# Patient Record
Sex: Female | Born: 1955 | Race: White | Hispanic: No | Smoking: Never smoker
Health system: Southern US, Community
[De-identification: ages and names within clinical notes are randomized; demographics above are authoritative.]

## PROBLEM LIST (undated history)

## (undated) DIAGNOSIS — E785 Hyperlipidemia, unspecified: Secondary | ICD-10-CM

## (undated) DIAGNOSIS — E039 Hypothyroidism, unspecified: Secondary | ICD-10-CM

## (undated) HISTORY — PX: ENDOMETRIAL ABLATION: SHX621

## (undated) HISTORY — DX: Hypothyroidism, unspecified: E03.9

## (undated) HISTORY — PX: ROTATOR CUFF REPAIR: SHX139

## (undated) HISTORY — DX: Hyperlipidemia, unspecified: E78.5

---
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
## 1999-11-28 ENCOUNTER — Other Ambulatory Visit: Admission: RE | Admit: 1999-11-28 | Discharge: 1999-11-28 | Payer: Self-pay | Admitting: Family Medicine

## 1999-12-10 ENCOUNTER — Ambulatory Visit (HOSPITAL_COMMUNITY): Admission: RE | Admit: 1999-12-10 | Discharge: 1999-12-10 | Payer: Self-pay | Admitting: Family Medicine

## 1999-12-10 ENCOUNTER — Encounter: Payer: Self-pay | Admitting: Family Medicine

## 2001-02-17 ENCOUNTER — Other Ambulatory Visit: Admission: RE | Admit: 2001-02-17 | Discharge: 2001-02-17 | Payer: Self-pay | Admitting: Family Medicine

## 2002-06-14 ENCOUNTER — Other Ambulatory Visit: Admission: RE | Admit: 2002-06-14 | Discharge: 2002-06-14 | Payer: Self-pay | Admitting: Family Medicine

## 2002-06-22 ENCOUNTER — Encounter: Payer: Self-pay | Admitting: Family Medicine

## 2002-06-22 ENCOUNTER — Ambulatory Visit (HOSPITAL_COMMUNITY): Admission: RE | Admit: 2002-06-22 | Discharge: 2002-06-22 | Payer: Self-pay | Admitting: Family Medicine

## 2003-10-05 ENCOUNTER — Other Ambulatory Visit: Admission: RE | Admit: 2003-10-05 | Discharge: 2003-10-05 | Payer: Self-pay | Admitting: Family Medicine

## 2004-10-30 ENCOUNTER — Ambulatory Visit (HOSPITAL_COMMUNITY): Admission: RE | Admit: 2004-10-30 | Discharge: 2004-10-30 | Payer: Self-pay | Admitting: Family Medicine

## 2006-06-02 ENCOUNTER — Ambulatory Visit (HOSPITAL_COMMUNITY): Admission: RE | Admit: 2006-06-02 | Discharge: 2006-06-02 | Payer: Self-pay | Admitting: Family Medicine

## 2007-06-06 ENCOUNTER — Ambulatory Visit (HOSPITAL_COMMUNITY): Admission: RE | Admit: 2007-06-06 | Discharge: 2007-06-06 | Payer: Self-pay | Admitting: Family Medicine

## 2008-06-06 ENCOUNTER — Ambulatory Visit (HOSPITAL_COMMUNITY): Admission: RE | Admit: 2008-06-06 | Discharge: 2008-06-06 | Payer: Self-pay | Admitting: Family Medicine

## 2009-07-01 ENCOUNTER — Ambulatory Visit (HOSPITAL_COMMUNITY): Admission: RE | Admit: 2009-07-01 | Discharge: 2009-07-01 | Payer: Self-pay | Admitting: Family Medicine

## 2010-07-03 ENCOUNTER — Ambulatory Visit (HOSPITAL_COMMUNITY): Admission: RE | Admit: 2010-07-03 | Discharge: 2010-07-03 | Payer: Self-pay | Admitting: Family Medicine

## 2011-06-03 ENCOUNTER — Other Ambulatory Visit (HOSPITAL_COMMUNITY): Payer: Self-pay | Admitting: Family Medicine

## 2011-06-03 DIAGNOSIS — Z1231 Encounter for screening mammogram for malignant neoplasm of breast: Secondary | ICD-10-CM

## 2011-07-06 ENCOUNTER — Ambulatory Visit (HOSPITAL_COMMUNITY)
Admission: RE | Admit: 2011-07-06 | Discharge: 2011-07-06 | Disposition: A | Discharge status: Home / Self Care | Payer: BC Managed Care – PPO | Source: Ambulatory Visit | Attending: Family Medicine | Admitting: Family Medicine

## 2011-07-06 DIAGNOSIS — Z1231 Encounter for screening mammogram for malignant neoplasm of breast: Secondary | ICD-10-CM | POA: Insufficient documentation

## 2011-08-11 ENCOUNTER — Other Ambulatory Visit: Payer: Self-pay | Admitting: Family Medicine

## 2011-08-11 DIAGNOSIS — R0989 Other specified symptoms and signs involving the circulatory and respiratory systems: Secondary | ICD-10-CM

## 2011-08-18 ENCOUNTER — Ambulatory Visit
Admission: RE | Admit: 2011-08-18 | Discharge: 2011-08-18 | Disposition: A | Discharge status: Home / Self Care | Payer: BC Managed Care – PPO | Source: Ambulatory Visit | Attending: Family Medicine | Admitting: Family Medicine

## 2011-08-18 DIAGNOSIS — R0989 Other specified symptoms and signs involving the circulatory and respiratory systems: Secondary | ICD-10-CM

## 2012-06-10 ENCOUNTER — Other Ambulatory Visit (HOSPITAL_COMMUNITY): Payer: Self-pay | Admitting: Family Medicine

## 2012-06-10 DIAGNOSIS — Z1231 Encounter for screening mammogram for malignant neoplasm of breast: Secondary | ICD-10-CM

## 2012-07-07 ENCOUNTER — Ambulatory Visit (HOSPITAL_COMMUNITY)
Admission: RE | Admit: 2012-07-07 | Discharge: 2012-07-07 | Disposition: A | Discharge status: Home / Self Care | Payer: No Typology Code available for payment source | Source: Ambulatory Visit | Attending: Family Medicine | Admitting: Family Medicine

## 2012-07-07 DIAGNOSIS — Z1231 Encounter for screening mammogram for malignant neoplasm of breast: Secondary | ICD-10-CM | POA: Insufficient documentation

## 2013-03-02 ENCOUNTER — Ambulatory Visit (INDEPENDENT_AMBULATORY_CARE_PROVIDER_SITE_OTHER): Payer: PRIVATE HEALTH INSURANCE | Admitting: Gynecology

## 2013-03-02 ENCOUNTER — Encounter: Payer: Self-pay | Admitting: Gynecology

## 2013-03-02 VITALS — BP 126/80 | HR 72 | Resp 12 | Ht 63.5 in | Wt 221.0 lb

## 2013-03-02 DIAGNOSIS — N950 Postmenopausal bleeding: Secondary | ICD-10-CM

## 2013-03-02 DIAGNOSIS — Z124 Encounter for screening for malignant neoplasm of cervix: Secondary | ICD-10-CM

## 2013-03-02 DIAGNOSIS — Z1211 Encounter for screening for malignant neoplasm of colon: Secondary | ICD-10-CM

## 2013-03-02 DIAGNOSIS — E7800 Pure hypercholesterolemia, unspecified: Secondary | ICD-10-CM

## 2013-03-02 DIAGNOSIS — E039 Hypothyroidism, unspecified: Secondary | ICD-10-CM

## 2013-03-02 DIAGNOSIS — Z01419 Encounter for gynecological examination (general) (routine) without abnormal findings: Secondary | ICD-10-CM

## 2013-03-02 HISTORY — DX: Hypothyroidism, unspecified: E03.9

## 2013-03-02 LAB — CBC
MCV: 83 fL (ref 78.0–100.0)
Platelets: 308 10*3/uL (ref 150–400)
RBC: 4.82 MIL/uL (ref 3.87–5.11)
RDW: 13.5 % (ref 11.5–15.5)
WBC: 7.8 10*3/uL (ref 4.0–10.5)

## 2013-03-02 LAB — FOLLICLE STIMULATING HORMONE: FSH: 54.7 m[IU]/mL

## 2013-03-02 NOTE — Addendum Note (Signed)
Addended by: Lorraine Lax on: 03/02/2013 02:21 PM   Modules accepted: Orders

## 2013-03-02 NOTE — Patient Instructions (Signed)

## 2013-03-02 NOTE — Progress Notes (Signed)
57 y.o. Divorced Caucasian female   G2P2002 here for annual exam. Pt reports menses are not regular.  She does not report hot flashes, does not have night sweats, does not have vaginal dryness.  She is not using lubricants,.  She does report post-menopasual bleeding (spotting).  Pt reports having a "long gap" 2y ago but was never without a cycle for 1y.  No change in weight, bowel or bladder.  No sexual activity for 16y, no abnormal PAP.  No fibroids.  Pt reports discharge sometimes bloody  Patient's last menstrual period was 08/02/2012.          Sexually active: no  The current method of family planning is post menopausal status.    Exercising: yes  The patient has a physically strenuous job, but has no regular exercise apart from work.  Last pap: 6 years ago Abnormal PAP: no Mammogram: 06/10/12 BSE: yes Colonoscopy: none DEXA: none Alcohol: 1-2 drinks/wk Tobacco: no  No health maintenance topics applied.  Family History  Problem Relation Age of Onset  . Heart Problems Mother   . Heart attack Father     There are no active problems to display for this patient.   Past Medical History  Diagnosis Date  . Anemia   . Hypothyroidism     Past Surgical History  Procedure Laterality Date  . Rotator cuff repair  20 years ago  . Wisdom tooth extraction  1973    Allergies: Augmentin  Current Outpatient Prescriptions  Medication Sig Dispense Refill  . Cetirizine HCl (ZYRTEC PO) Take by mouth at bedtime as needed.      . ALPRAZolam (XANAX) 0.5 MG tablet       . levothyroxine (SYNTHROID, LEVOTHROID) 125 MCG tablet       . simvastatin (ZOCOR) 40 MG tablet        No current facility-administered medications for this visit.    ROS: Pertinent items are noted in HPI.  Exam:    BP 126/80  Pulse 72  Resp 12  Ht 5' 3.5" (1.613 m)  Wt 221 lb (100.245 kg)  BMI 38.53 kg/m2  LMP 08/02/2012 Weight change: @WEIGHTCHANGE @ Last 3 height recordings:  Ht Readings from Last 3 Encounters:   03/02/13 5' 3.5" (1.613 m)   General appearance: alert, cooperative and appears stated age Head: Normocephalic, without obvious abnormality, atraumatic Neck: no adenopathy, no carotid bruit, no JVD, supple, symmetrical, trachea midline and thyroid not enlarged, symmetric, no tenderness/mass/nodules Lungs: clear to auscultation bilaterally Breasts: normal appearance, no masses or tenderness Heart: regular rate and rhythm, S1, S2 normal, no murmur, click, rub or gallop Abdomen: soft, non-tender; bowel sounds normal; no masses,  no organomegaly Extremities: extremities normal, atraumatic, no cyanosis or edema Skin: Skin color, texture, turgor normal. No rashes or lesions Lymph nodes: Cervical, supraclavicular, and axillary nodes normal. no inguinal nodes palpated Neurologic: Grossly normal   Pelvic: External genitalia:  no lesions              Urethra: normal appearing urethra with no masses, tenderness or lesions              Bartholins and Skenes: normal                  Vagina: blood in vault              Cervix: active trickle from os, no gross lesions              Pap taken: yes  Bimanual Exam:  Uterus:  Slightly enlarged, limited by habitus                                      Adnexa:    no masses                                      Rectovaginal: Confirms                                      Anus:  normal sphincter tone, no lesions  A: Post menopausal bleeding      P: discussed that pt is at extreme of menses and recommend U/S and EMB to rule out uterine or cervical pathology.  Pt expressed understand and will return to office for evaluation.   pap smear with HPV today FSH, CBC Recommend pt have baseline coloscopy after bleeding evaluation complete return annually or prn   An After Visit Summary was printed and given to the patient.

## 2013-03-06 ENCOUNTER — Ambulatory Visit (INDEPENDENT_AMBULATORY_CARE_PROVIDER_SITE_OTHER): Payer: PRIVATE HEALTH INSURANCE | Admitting: Gynecology

## 2013-03-06 ENCOUNTER — Ambulatory Visit (INDEPENDENT_AMBULATORY_CARE_PROVIDER_SITE_OTHER): Payer: PRIVATE HEALTH INSURANCE

## 2013-03-06 ENCOUNTER — Other Ambulatory Visit: Payer: Self-pay | Admitting: Gynecology

## 2013-03-06 DIAGNOSIS — N83339 Acquired atrophy of ovary and fallopian tube, unspecified side: Secondary | ICD-10-CM

## 2013-03-06 DIAGNOSIS — N950 Postmenopausal bleeding: Secondary | ICD-10-CM

## 2013-03-06 DIAGNOSIS — N840 Polyp of corpus uteri: Secondary | ICD-10-CM | POA: Insufficient documentation

## 2013-03-06 DIAGNOSIS — N8300 Follicular cyst of ovary, unspecified side: Secondary | ICD-10-CM

## 2013-03-06 MED ORDER — MISOPROSTOL 200 MCG PO TABS: ORAL_TABLET | ORAL | Status: DC

## 2013-03-06 NOTE — Progress Notes (Signed)
Pt here for u/s, emb for PMB, u/s images rebviewed with pt, lining notyed to be 12.75mm, homogeneous in nature, based on that, we recommend that the pt have a sonohysterogram, consent obtained. The speculum was placed, no bleeding from cervix was noted today, cervix was cleansed with betadine and insemination catheter was advanced, the walls were distended.  The findings were as noted below: endometrial defect 3.3x0.9x1.4cm noted with fundal attachment, suspicions for polyp.  Speculum was replaced and endometrial biopsy was obtained, uterus sounded to 9cm.  Pt tolerated both procedures well. Tissue to path.  We discussed proceeding woth a d&c hysteroscopy.  The procedure was discussed at length, the ACOG handoutwas not given to the patientprior to the consult.  Pt informed regarding risks and benefits of surgery, including but not limited to bleeding, infections, damage to bowel or bladder due to uterine perforation either during the procedure or during dilation.  A medication will be be given preoperatively to soften the cervix.    There is a risk of formation of a deep vein thrombus in the extremities was discussed, although PAS will be placed to minimize the risk, the patient was informed that a pulmonary embolism could still form and could result in death. Perineal nerve damage from placement during surgery was discussed She was instructed on signs and symptoms to be aware of and the need to call if they should develop.  Fluid overload from the distending media was discussed as was the usual intra-operative safety precautions in place.  All questions were addressed.  Post-operative medications  were not given to the patient.   Length of time discussing above 65m, >50% face to face

## 2013-03-06 NOTE — Addendum Note (Signed)
Addended by: Douglass Rivers on: 03/06/2013 04:09 PM   Modules accepted: Orders

## 2013-03-08 ENCOUNTER — Telehealth: Payer: Self-pay | Admitting: Orthopedic Surgery

## 2013-03-08 NOTE — Telephone Encounter (Signed)
Spoke with pt about appt with Dr. Loreta Ave 03-23-13 at 10:30. Pt to call and reschedule. Phone number given.

## 2013-03-09 ENCOUNTER — Telehealth: Payer: Self-pay | Admitting: Gynecology

## 2013-03-09 NOTE — Telephone Encounter (Signed)
Spoke with patient about her insurance benefits for surgery, Patient agrees to pay the full amount 2 weeks prior to her surgery date. Patient states that she discussed a surgery date with Dr. Farrel Gobble and they decided on 7/16. Chart passed onto [facility] for scheduling.

## 2013-03-10 LAB — IPS CERVICAL/ECC/EMB/VULVAR/VAGINAL BIOPSY

## 2013-03-16 ENCOUNTER — Telehealth: Payer: Self-pay | Admitting: *Deleted

## 2013-03-16 NOTE — Telephone Encounter (Signed)
Message copied by Alisa Graff on Thu Mar 16, 2013 11:47 AM ------      Message from: Douglass Rivers      Created: Sun Mar 12, 2013 11:16 AM       Did I put in for d&c hysteroscopy? Results to pt via mychart ------

## 2013-03-16 NOTE — Telephone Encounter (Signed)
Patient notified that Hysteroscopy/polyp resect/D&C sched for 04-05-13 at [facility] at 1000. Instructions given and mailed to patient. Post op appt scheduled. Call PRN

## 2013-03-16 NOTE — Telephone Encounter (Signed)
Message copied by Alisa Graff on Thu Mar 16, 2013 11:56 AM ------      Message from: Douglass Rivers      Created: Sun Mar 12, 2013 11:16 AM       Did I put in for d&c hysteroscopy? Results to pt via mychart ------

## 2013-03-22 ENCOUNTER — Telehealth: Payer: Self-pay | Admitting: Gynecology

## 2013-03-22 NOTE — Telephone Encounter (Signed)
Routed to Sally 

## 2013-03-22 NOTE — Telephone Encounter (Signed)
Needs work note for up coming surgery on the 16 th.

## 2013-03-29 ENCOUNTER — Encounter (HOSPITAL_COMMUNITY): Payer: Self-pay | Admitting: Pharmacist

## 2013-04-05 ENCOUNTER — Encounter (HOSPITAL_COMMUNITY)
Admission: RE | Disposition: A | Discharge status: Home / Self Care | Payer: Self-pay | Source: Ambulatory Visit | Attending: Gynecology

## 2013-04-05 ENCOUNTER — Encounter (HOSPITAL_COMMUNITY): Payer: Self-pay | Admitting: Anesthesiology

## 2013-04-05 ENCOUNTER — Encounter (HOSPITAL_COMMUNITY): Payer: Self-pay | Admitting: *Deleted

## 2013-04-05 ENCOUNTER — Ambulatory Visit (HOSPITAL_COMMUNITY)
Admission: RE | Admit: 2013-04-05 | Discharge: 2013-04-05 | Disposition: A | Discharge status: Home / Self Care | Payer: PRIVATE HEALTH INSURANCE | Source: Ambulatory Visit | Attending: Gynecology | Admitting: Gynecology

## 2013-04-05 ENCOUNTER — Ambulatory Visit (HOSPITAL_COMMUNITY): Payer: PRIVATE HEALTH INSURANCE | Admitting: Anesthesiology

## 2013-04-05 DIAGNOSIS — N950 Postmenopausal bleeding: Secondary | ICD-10-CM | POA: Insufficient documentation

## 2013-04-05 DIAGNOSIS — N840 Polyp of corpus uteri: Secondary | ICD-10-CM | POA: Insufficient documentation

## 2013-04-05 DIAGNOSIS — Z9889 Other specified postprocedural states: Secondary | ICD-10-CM

## 2013-04-05 LAB — CBC
Hemoglobin: 13.6 g/dL (ref 12.0–15.0)
MCH: 29.4 pg (ref 26.0–34.0)
Platelets: 269 10*3/uL (ref 150–400)
RBC: 4.63 MIL/uL (ref 3.87–5.11)
WBC: 7.5 10*3/uL (ref 4.0–10.5)

## 2013-04-05 SURGERY — DILATATION & CURETTAGE/HYSTEROSCOPY WITH TRUCLEAR
Anesthesia: General | Site: Uterus | Wound class: Clean Contaminated

## 2013-04-05 MED ORDER — PROPOFOL 10 MG/ML IV EMUL
INTRAVENOUS | Status: AC
  Filled 2013-04-05: qty 20

## 2013-04-05 MED ORDER — BUPIVACAINE-EPINEPHRINE PF 0.25-1:200000 % IJ SOLN
INTRAMUSCULAR | Status: AC
  Filled 2013-04-05: qty 30

## 2013-04-05 MED ORDER — ONDANSETRON HCL 4 MG/2ML IJ SOLN: 4.0000 mg | Freq: Once | INTRAMUSCULAR | Status: DC | PRN

## 2013-04-05 MED ORDER — MIDAZOLAM HCL 2 MG/2ML IJ SOLN
INTRAMUSCULAR | Status: AC
  Filled 2013-04-05: qty 2

## 2013-04-05 MED ORDER — SODIUM CHLORIDE 0.9 % IR SOLN
Status: DC | PRN
  Administered 2013-04-05 (×2): 3000 mL

## 2013-04-05 MED ORDER — FENTANYL CITRATE 0.05 MG/ML IJ SOLN
INTRAMUSCULAR | Status: DC | PRN
  Administered 2013-04-05 (×2): 50 ug via INTRAVENOUS

## 2013-04-05 MED ORDER — KETOROLAC TROMETHAMINE 30 MG/ML IJ SOLN: 15.0000 mg | Freq: Once | INTRAMUSCULAR | Status: DC | PRN

## 2013-04-05 MED ORDER — LACTATED RINGERS IV SOLN: INTRAVENOUS | Status: DC

## 2013-04-05 MED ORDER — LACTATED RINGERS IV SOLN
INTRAVENOUS | Status: DC
  Administered 2013-04-05 (×2): via INTRAVENOUS

## 2013-04-05 MED ORDER — ONDANSETRON HCL 4 MG/2ML IJ SOLN
INTRAMUSCULAR | Status: DC | PRN
  Administered 2013-04-05: 4 mg via INTRAVENOUS

## 2013-04-05 MED ORDER — FENTANYL CITRATE 0.05 MG/ML IJ SOLN
INTRAMUSCULAR | Status: AC
  Filled 2013-04-05: qty 5

## 2013-04-05 MED ORDER — BUPIVACAINE-EPINEPHRINE 0.25% -1:200000 IJ SOLN
INTRAMUSCULAR | Status: DC | PRN
  Administered 2013-04-05: 30 mL

## 2013-04-05 MED ORDER — LIDOCAINE HCL (CARDIAC) 20 MG/ML IV SOLN
INTRAVENOUS | Status: AC
  Filled 2013-04-05: qty 5

## 2013-04-05 MED ORDER — PROPOFOL 10 MG/ML IV BOLUS
INTRAVENOUS | Status: DC | PRN
  Administered 2013-04-05: 200 mg via INTRAVENOUS

## 2013-04-05 MED ORDER — LIDOCAINE HCL (CARDIAC) 20 MG/ML IV SOLN
INTRAVENOUS | Status: DC | PRN
  Administered 2013-04-05: 60 mg via INTRAVENOUS

## 2013-04-05 MED ORDER — MIDAZOLAM HCL 5 MG/5ML IJ SOLN
INTRAMUSCULAR | Status: DC | PRN
  Administered 2013-04-05: 2 mg via INTRAVENOUS

## 2013-04-05 MED ORDER — KETOROLAC TROMETHAMINE 30 MG/ML IJ SOLN
INTRAMUSCULAR | Status: DC | PRN
  Administered 2013-04-05: 30 mg via INTRAVENOUS

## 2013-04-05 MED ORDER — ONDANSETRON HCL 4 MG/2ML IJ SOLN
INTRAMUSCULAR | Status: AC
  Filled 2013-04-05: qty 2

## 2013-04-05 MED ORDER — SILVER NITRATE-POT NITRATE 75-25 % EX MISC
CUTANEOUS | Status: AC
  Filled 2013-04-05: qty 1

## 2013-04-05 MED ORDER — LIDOCAINE HCL 2 % IJ SOLN
INTRAMUSCULAR | Status: AC
  Filled 2013-04-05: qty 20

## 2013-04-05 MED ORDER — LIDOCAINE HCL 2 % IJ SOLN
INTRAMUSCULAR | Status: DC | PRN
  Administered 2013-04-05: 20 mL

## 2013-04-05 MED ORDER — EPHEDRINE 5 MG/ML INJ
INTRAVENOUS | Status: AC
  Filled 2013-04-05: qty 10

## 2013-04-05 MED ORDER — MEPERIDINE HCL 25 MG/ML IJ SOLN: 6.2500 mg | INTRAMUSCULAR | Status: DC | PRN

## 2013-04-05 MED ORDER — KETOROLAC TROMETHAMINE 30 MG/ML IJ SOLN
INTRAMUSCULAR | Status: AC
  Filled 2013-04-05: qty 1

## 2013-04-05 MED ORDER — FENTANYL CITRATE 0.05 MG/ML IJ SOLN: 25.0000 ug | INTRAMUSCULAR | Status: DC | PRN

## 2013-04-05 MED ORDER — EPHEDRINE SULFATE 50 MG/ML IJ SOLN
INTRAMUSCULAR | Status: DC | PRN
  Administered 2013-04-05: 10 mg via INTRAVENOUS

## 2013-04-05 SURGICAL SUPPLY — 20 items
BLADE INCISOR TRUC PLUS 2.9 (ABLATOR) IMPLANT
CANISTERS HI-FLOW 3000CC (CANNISTER) ×6 IMPLANT
CATH ROBINSON RED A/P 16FR (CATHETERS) ×2 IMPLANT
CONTAINER PREFILL 10% NBF 60ML (FORM) ×4 IMPLANT
DRAPE HYSTEROSCOPY (DRAPE) ×1 IMPLANT
DRESSING TELFA 8X3 (GAUZE/BANDAGES/DRESSINGS) ×2 IMPLANT
GLOVE BIOGEL M 6.5 STRL (GLOVE) ×2 IMPLANT
GLOVE BIOGEL PI IND STRL 7.0 (GLOVE) ×1 IMPLANT
GLOVE BIOGEL PI INDICATOR 7.0 (GLOVE) ×1
GOWN STRL REIN XL XLG (GOWN DISPOSABLE) ×4 IMPLANT
INCISOR TRUC PLUS BLADE 2.9 (ABLATOR) ×2
KIT HYSTEROSCOPY TRUCLEAR (ABLATOR) ×1 IMPLANT
MORCELLATOR RECIP TRUCLEAR 4.0 (ABLATOR) IMPLANT
NDL SPNL 22GX3.5 QUINCKE BK (NEEDLE) ×1 IMPLANT
NEEDLE SPNL 22GX3.5 QUINCKE BK (NEEDLE) ×2 IMPLANT
PACK VAGINAL MINOR WOMEN LF (CUSTOM PROCEDURE TRAY) ×2 IMPLANT
PAD OB MATERNITY 4.3X12.25 (PERSONAL CARE ITEMS) ×2 IMPLANT
SYRINGE 10CC LL (SYRINGE) ×2 IMPLANT
TOWEL OR 17X24 6PK STRL BLUE (TOWEL DISPOSABLE) ×4 IMPLANT
WATER STERILE IRR 1000ML POUR (IV SOLUTION) ×2 IMPLANT

## 2013-04-05 NOTE — H&P (Signed)
57 y.o. Divorced Caucasian female  G2P2002 here for annual exam. Pt reports menses are not regular. She does not report hot flashes, does not have night sweats, does not have vaginal dryness. She is not using lubricants,. She does report post-menopasual bleeding (spotting). Pt reports having a "long gap" 2y ago but was never without a cycle for 1y. No change in weight, bowel or bladder. No sexual activity for 16y, no abnormal PAP. No fibroids. Pt reports discharge sometimes bloody  Patient's last menstrual period was 08/02/2012.  Sexually active: no  The current method of family planning is post menopausal status.  Exercising: yes The patient has a physically strenuous job, but has no regular exercise apart from work.  Last pap: 6 years ago  Abnormal PAP: no  Mammogram: 06/10/12  BSE: yes  Colonoscopy: none  DEXA: none  Alcohol: 1-2 drinks/wk  Tobacco: no  No health maintenance topics applied.  Family History   Problem  Relation  Age of Onset   .  Heart Problems  Mother    .  Heart attack  Father    There are no active problems to display for this patient.  Past Medical History   Diagnosis  Date   .  Anemia    .  Hypothyroidism     Past Surgical History   Procedure  Laterality  Date   .  Rotator cuff repair   20 years ago   .  Wisdom tooth extraction   1973   Allergies: Augmentin  Current Outpatient Prescriptions   Medication  Sig  Dispense  Refill   .  Cetirizine HCl (ZYRTEC PO)  Take by mouth at bedtime as needed.     .  ALPRAZolam (XANAX) 0.5 MG tablet      .  levothyroxine (SYNTHROID, LEVOTHROID) 125 MCG tablet      .  simvastatin (ZOCOR) 40 MG tablet       No current facility-administered medications for this visit.   ROS: Pertinent items are noted in HPI.  Exam:  BP 126/80  Pulse 72  Resp 12  Ht 5' 3.5" (1.613 m)  Wt 221 lb (100.245 kg)  BMI 38.53 kg/m2  LMP 08/02/2012 Weight change: @WEIGHTCHANGE @ Last 3 height recordings:  Ht Readings from Last 3 Encounters:    03/02/13  5' 3.5" (1.613 m)   General appearance: alert, cooperative and appears stated age  Head: Normocephalic, without obvious abnormality, atraumatic  Neck: no adenopathy, no carotid bruit, no JVD, supple, symmetrical, trachea midline and thyroid not enlarged, symmetric, no tenderness/mass/nodules  Lungs: clear to auscultation bilaterally  Breasts: normal appearance, no masses or tenderness  Heart: regular rate and rhythm, S1, S2 normal, no murmur, click, rub or gallop  Abdomen: soft, non-tender; bowel sounds normal; no masses, no organomegaly  Extremities: extremities normal, atraumatic, no cyanosis or edema  Skin: Skin color, texture, turgor normal. No rashes or lesions  Lymph nodes: Cervical, supraclavicular, and axillary nodes normal.  no inguinal nodes palpated  Neurologic: Grossly normal  Pelvic: External genitalia: no lesions  Urethra: normal appearing urethra with no masses, tenderness or lesions  Bartholins and Skenes: normal  Vagina: blood in vault  Cervix: active trickle from os, no gross lesions  Pap taken: yes  Bimanual Exam: Uterus: Slightly enlarged, limited by habitus  Adnexa: no masses  Rectovaginal: Confirms  Anus: normal sphincter tone, no lesions  A: Post menopausal bleeding  P: discussed that pt is at extreme of menses and recommend U/S and EMB to rule out  uterine or cervical pathology. Pt expressed understand and will return to office for evaluation.  pap smear with HPV today  FSH, CBC  Recommend pt have baseline coloscopy after bleeding evaluation complete  return annually or prn  An After Visit Summary was printed and given to the patient.

## 2013-04-05 NOTE — Anesthesia Preprocedure Evaluation (Signed)
Anesthesia Evaluation  Patient identified by MRN, date of birth, ID band Patient awake    Reviewed: Allergy & Precautions, H&P , NPO status , Patient's Chart, lab work & pertinent test results  Airway Mallampati: II TM Distance: >3 FB Neck ROM: full    Dental no notable dental hx. (+) Teeth Intact   Pulmonary neg pulmonary ROS,    Pulmonary exam normal       Cardiovascular negative cardio ROS      Neuro/Psych negative neurological ROS     GI/Hepatic negative GI ROS, Neg liver ROS,   Endo/Other  Hypothyroidism   Renal/GU negative Renal ROS  negative genitourinary   Musculoskeletal negative musculoskeletal ROS (+)   Abdominal Normal abdominal exam  (+)   Peds negative pediatric ROS (+)  Hematology negative hematology ROS (+)   Anesthesia Other Findings   Reproductive/Obstetrics negative OB ROS                           Anesthesia Physical Anesthesia Plan  ASA: II  Anesthesia Plan: General   Post-op Pain Management:    Induction: Intravenous  Airway Management Planned: LMA  Additional Equipment:   Intra-op Plan:   Post-operative Plan:   Informed Consent: I have reviewed the patients History and Physical, chart, labs and discussed the procedure including the risks, benefits and alternatives for the proposed anesthesia with the patient or authorized representative who has indicated his/her understanding and acceptance.     Plan Discussed with: CRNA and Surgeon  Anesthesia Plan Comments:         Anesthesia Quick Evaluation

## 2013-04-05 NOTE — Op Note (Signed)
NAMEAPPHIA, CROPLEY                ACCOUNT NO.:  [id number]  MEDICAL RECORD NO.:  [id number]  LOCATION:  [facility]                          FACILITY:  [facility]  PHYSICIAN:  Ivor Costa. Farrel Gobble, M.D. DATE OF BIRTH:  15-Jan-1956  DATE OF PROCEDURE:  04/05/2013 DATE OF DISCHARGE:  04/05/2013                              OPERATIVE REPORT   PREOPERATIVE DIAGNOSES: 1. Postmenopausal bleeding. 2. Endometrial polyp.  POSTOPERATIVE DIAGNOSES: 1. Postmenopausal bleeding. 2. Endometrial polyp.  PROCEDURE:  D and C hysteroscopy with TRUCLEAR resectoscope.  SURGEON:  Ivor Costa. Farrel Gobble, M.D.  ANESTHESIA:  General and paracervical block.  IV FLUIDS:  1300 mL of lactated Ringer's.  URINE OUTPUT:  100 mL.  ESTIMATED BLOOD LOSS:  10 mL.  I AND O DEFICIT:  Less than 330.  FINDINGS:  Uterus sounded to 7.  The cervix was grossly dilated.  A prolapsing necrotic-appearing polyp was noted.  The base of which was appreciated at the left cornua.  Two other small polyps were noted in the cavity.  Otherwise, cervix and uterine contours were unremarkable.  INDICATIONS:  The patient is a 57 year old with postmenopausal bleeding, who was noted to have an endometrial defect on sonohysterogram and now presents for surgery.  PROCEDURE:  The patient was taken to the operating room, placed in the dorsal lithotomy position, prepped and draped in usual sterile fashion. A bivalve speculum was placed in the vagina.  The cervix was visualized. It was noted to be dilated and hyperemic at the os.  The cervix was stabilized with a single tooth tenaculum.  A paracervical block of equal parts 2% lidocaine, 0.25% Marcaine was placed circumferentially for a total of 20 mL.  The cervix was grossly dilated.  The uterus sounded to 7.  The #17 dilator was advanced through the cervix with ease.  The polyp graspers were then advanced through the cervix and what appeared to be a rather large polypoid mass was delivered with ease.   The hysteroscope was then advanced through the cervix and it was obvious that there was still a remnant of the polyp noted just adjacent to the tubal ostia on the left.  The polyp forceps were then advanced back through the cavity.  The passes revealed slightly more tissue and curettage was performed for essentially no tissue.  The hysteroscope was replaced and there were still noted to be a moderate amount of tissue still at the base.  At this point, we elected to use the TRUCLEAR polyp resectoscope, which was advanced back through the cervix and through the fundus.  Dilation was assured.  The resectoscope was prepped appropriately and the tissue from the ostial area was removed.  In addition, we removed a small polyp that was noted on the posterior aspect of the uterus, also noted on the sidewall on the left.  All ports were noted to be hemostatic.  The instruments were then removed.  The remainder of the cavity remained unremarkable.  The tissue went to Pathology.     Ivor Costa. Farrel Gobble, M.D.     THL/MEDQ  D:  04/05/2013  T:  04/05/2013  Job:  [id number]

## 2013-04-05 NOTE — H&P (View-Only) (Signed)
Pt here for u/s, emb for PMB, u/s images rebviewed with pt, lining notyed to be 12.78mm, homogeneous in nature, based on that, we recommend that the pt have a sonohysterogram, consent obtained. The speculum was placed, no bleeding from cervix was noted today, cervix was cleansed with betadine and insemination catheter was advanced, the walls were distended.  The findings were as noted below: endometrial defect 3.3x0.9x1.4cm noted with fundal attachment, suspicions for polyp.  Speculum was replaced and endometrial biopsy was obtained, uterus sounded to 9cm.  Pt tolerated both procedures well. Tissue to path.  We discussed proceeding woth a d&c hysteroscopy.  The procedure was discussed at length, the ACOG handoutwas not given to the patientprior to the consult.  Pt informed regarding risks and benefits of surgery, including but not limited to bleeding, infections, damage to bowel or bladder due to uterine perforation either during the procedure or during dilation.  A medication will be be given preoperatively to soften the cervix.    There is a risk of formation of a deep vein thrombus in the extremities was discussed, although PAS will be placed to minimize the risk, the patient was informed that a pulmonary embolism could still form and could result in death. Perineal nerve damage from placement during surgery was discussed She was instructed on signs and symptoms to be aware of and the need to call if they should develop.  Fluid overload from the distending media was discussed as was the usual intra-operative safety precautions in place.  All questions were addressed.  Post-operative medications  were not given to the patient.   Length of time discussing above 58m, >50% face to face

## 2013-04-05 NOTE — Transfer of Care (Signed)
Immediate Anesthesia Transfer of Care Note  Patient: Monique [name]  Procedure(s) Performed: Procedure(s): DILATATION & CURETTAGE/HYSTEROSCOPY WITH TRUECLEAR (N/A)  Patient Location: PACU  Anesthesia Type:General  Level of Consciousness: awake, alert  and oriented  Airway & Oxygen Therapy: Patient Spontanous Breathing and Patient connected to nasal cannula oxygen  Post-op Assessment: Report given to PACU RN and Post -op Vital signs reviewed and stable  Post vital signs: stable  Complications: No apparent anesthesia complications

## 2013-04-05 NOTE — Interval H&P Note (Signed)
History and Physical Interval Note:  04/05/2013 9:50 AM  Monique [name]  has presented today for surgery, with the diagnosis of PMB, endometrial polyp  The various methods of treatment have been discussed with the patient and family. After consideration of risks, benefits and other options for treatment, the patient has consented to  Procedure(s): DILATATION & CURETTAGE/HYSTEROSCOPY WITH TRUECLEAR (N/A) as a surgical intervention .  The patient's history has been reviewed, patient examined, no change in status, stable for surgery.  I have reviewed the patient's chart and labs.  Questions were answered to the patient's satisfaction.     Kinser Fellman H

## 2013-04-05 NOTE — Brief Op Note (Signed)
04/05/2013  11:05 AM  PATIENT:  Monique [name]  57 y.o. female  PRE-OPERATIVE DIAGNOSIS:  PMB, endometrial polyp  POST-OPERATIVE DIAGNOSIS:  endometrial polyp  PROCEDURE:  Procedure(s): DILATATION & CURETTAGE/HYSTEROSCOPY WITH TRUECLEAR (N/A)  SURGEON:  Surgeon(s) and Role:    * Bennye Alm, MD - Primary  PHYSICIAN ASSISTANT:   ASSISTANTS: none   ANESTHESIA:   general and paracervical block  EBL:  Total I/O In: 1300 [I.V.:1300] Out: 110 [Urine:100; Blood:10]  BLOOD ADMINISTERED:none  DRAINS: none   LOCAL MEDICATIONS USED: 0.25% MARCAINE   ,2% LIDOCAINE with epi  and Amount: 20 ml  SPECIMEN:  Source of Specimen:  uterus  DISPOSITION OF SPECIMEN:  PATHOLOGY  COUNTS:  YES  TOURNIQUET:  * No tourniquets in log *  DICTATION: .Other Dictation: Dictation Number [id number]  PLAN OF CARE: Discharge to home after PACU  PATIENT DISPOSITION:  PACU - hemodynamically stable.   Delay start of Pharmacological VTE agent (>24hrs) due to surgical blood loss or risk of bleeding: not applicable

## 2013-04-05 NOTE — Anesthesia Postprocedure Evaluation (Signed)
Anesthesia Post Note  Patient: Monique [name]  Procedure(s) Performed: Procedure(s) (LRB): DILATATION & CURETTAGE/HYSTEROSCOPY WITH TRUECLEAR (N/A)  Anesthesia type: General  Patient location: PACU  Post pain: Pain level controlled  Post assessment: Post-op Vital signs reviewed  Last Vitals:  Filed Vitals:   04/05/13 1130  BP:   Pulse: 68  Temp:   Resp: 12    Post vital signs: Reviewed  Level of consciousness: sedated  Complications: No apparent anesthesia complications

## 2013-04-06 NOTE — Telephone Encounter (Signed)
Dr Farrel Gobble, Did this patient get a note out of work this week?  Anything I need to do?

## 2013-04-09 NOTE — Telephone Encounter (Signed)
SHE CAN HAVE A NOTE

## 2013-04-20 ENCOUNTER — Encounter: Payer: Self-pay | Admitting: Gynecology

## 2013-04-20 ENCOUNTER — Ambulatory Visit (INDEPENDENT_AMBULATORY_CARE_PROVIDER_SITE_OTHER): Payer: PRIVATE HEALTH INSURANCE | Admitting: Gynecology

## 2013-04-20 VITALS — BP 130/68 | HR 76 | Resp 16 | Ht 65.0 in | Wt 225.0 lb

## 2013-04-20 DIAGNOSIS — N950 Postmenopausal bleeding: Secondary | ICD-10-CM

## 2013-04-20 DIAGNOSIS — N840 Polyp of corpus uteri: Secondary | ICD-10-CM

## 2013-04-20 DIAGNOSIS — Z9889 Other specified postprocedural states: Secondary | ICD-10-CM

## 2013-04-20 NOTE — Progress Notes (Signed)
Subjective:     Monique [name] is a 57 y.o. female who presents to the clinic 2 weeks status post operative hysteroscopy for postmenopausal bleeding. Eating a regular diet without difficulty. Bowel movements are normal. The patient is not having any pain.  Deneis any bleeding since surgery, no pain since either  The following portions of the patient's history were reviewed and updated as appropriate: allergies, current medications, past family history, past medical history, past social history, past surgical history and problem list.  Review of Systems Pertinent items are noted in HPI.    Objective:    BP 130/68  Pulse 76  Resp 16  Ht 5\' 5"  (1.651 m)  Wt 225 lb (102.059 kg)  BMI 37.44 kg/m2  LMP 08/02/2012 General:  alert, cooperative and appears stated age  Abdomen: soft, bowel sounds active, non-tender  Incision:   n/a     pelvic: Pelvic exam: VULVA: normal appearing vulva with no masses, tenderness or lesions, VAGINA: normal appearing vagina with normal color and discharge, no lesions, CERVIX: normal appearing cervix without discharge or lesions, UTERUS: uterus is normal size, shape, consistency and nontender, ADNEXA: normal adnexa in size, nontender and no masses.  Assessment:    Doing well postoperatively. Operative findings again reviewed. Pathology report discussed.    Plan:    1. Continue any current medications. 2. Wound care discussed. 3. Activity restrictions: none 4. Anticipated return to work: now.

## 2013-04-21 NOTE — Telephone Encounter (Signed)
Patient had post op 04-20-13.

## 2013-06-08 ENCOUNTER — Other Ambulatory Visit: Payer: Self-pay | Admitting: Gynecology

## 2013-06-08 DIAGNOSIS — Z1231 Encounter for screening mammogram for malignant neoplasm of breast: Secondary | ICD-10-CM

## 2013-07-04 ENCOUNTER — Encounter: Payer: Self-pay | Admitting: Gynecology

## 2013-07-07 ENCOUNTER — Ambulatory Visit (HOSPITAL_COMMUNITY): Payer: PRIVATE HEALTH INSURANCE

## 2013-07-07 ENCOUNTER — Ambulatory Visit (HOSPITAL_COMMUNITY)
Admission: RE | Admit: 2013-07-07 | Discharge: 2013-07-07 | Disposition: A | Discharge status: Home / Self Care | Payer: PRIVATE HEALTH INSURANCE | Source: Ambulatory Visit | Attending: Gynecology | Admitting: Gynecology

## 2013-07-07 DIAGNOSIS — Z1231 Encounter for screening mammogram for malignant neoplasm of breast: Secondary | ICD-10-CM

## 2013-07-27 ENCOUNTER — Other Ambulatory Visit: Payer: Self-pay

## 2013-08-11 ENCOUNTER — Encounter: Payer: Self-pay | Admitting: Gynecology

## 2014-03-08 ENCOUNTER — Ambulatory Visit: Payer: PRIVATE HEALTH INSURANCE | Admitting: Gynecology

## 2014-03-09 ENCOUNTER — Ambulatory Visit: Payer: PRIVATE HEALTH INSURANCE | Admitting: Gynecology

## 2014-03-16 ENCOUNTER — Ambulatory Visit (INDEPENDENT_AMBULATORY_CARE_PROVIDER_SITE_OTHER): Payer: PRIVATE HEALTH INSURANCE | Admitting: Gynecology

## 2014-03-16 ENCOUNTER — Encounter: Payer: Self-pay | Admitting: Gynecology

## 2014-03-16 VITALS — BP 130/80 | HR 74 | Resp 18 | Ht 63.75 in | Wt 224.0 lb

## 2014-03-16 DIAGNOSIS — Z1211 Encounter for screening for malignant neoplasm of colon: Secondary | ICD-10-CM

## 2014-03-16 DIAGNOSIS — Z01419 Encounter for gynecological examination (general) (routine) without abnormal findings: Secondary | ICD-10-CM

## 2014-03-16 NOTE — Progress Notes (Signed)
58 y.o. Divorced Caucasian female   G2P2002 here for annual exam. Pt reports menses are absent. She does not report hot flashes, does not have night sweats, does not have vaginal dryness.  She is not using lubricants  She does not report post-menopasual bleeding since surgery  Patient's last menstrual period was 08/02/2012.          Sexually active: No.  The current method of family planning is post menopausal status.    Exercising: Yes.    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 Tetanus/tdap  07/10/1974  . Colonoscopy  07/10/2005  . Influenza Vaccine  04/21/2014  . Mammogram  07/08/2015  . Pap Smear  03/02/2016    Family History  Problem Relation Age of Onset  . Heart Problems Mother   . Heart attack Father     Patient Active Problem List   Diagnosis Date Noted  . Endometrial polyp 03/06/2013  . Unspecified hypothyroidism 03/02/2013  . Pure hypercholesterolemia 03/02/2013  . Post-menopausal bleeding 03/02/2013    Past Medical History  Diagnosis Date  . Hypothyroidism   . Unspecified hypothyroidism 03/02/2013    Past Surgical History  Procedure Laterality Date  . Rotator cuff repair  20 years ago  . Wisdom tooth extraction  1973    Allergies: Augmentin  Current Outpatient Prescriptions  Medication Sig Dispense Refill  . ALPRAZolam (XANAX) 0.5 MG tablet Take 0.5 mg by mouth at bedtime.       . cetirizine (ZYRTEC) 10 MG tablet Take 10 mg by mouth at bedtime.      . fluticasone (FLONASE) 50 MCG/ACT nasal spray as needed.       Marland Kitchen. levothyroxine (SYNTHROID, LEVOTHROID) 150 MCG tablet Take 150 mcg by mouth daily before breakfast.      . simvastatin (ZOCOR) 40 MG tablet Take 40 mg by mouth at bedtime.        No current  facility-administered medications for this visit.    ROS: Pertinent items are noted in HPI.  Exam:    BP 130/80  Pulse 74  Resp 18  Ht 5' 3.75" (1.619 m)  Wt 224 lb (101.606 kg)  BMI 38.76 kg/m2  LMP 08/02/2012 Weight change: @WEIGHTCHANGE @ Last 3 height recordings:  Ht Readings from Last 3 Encounters:  03/16/14 5' 3.75" (1.619 m)  04/20/13 5\' 5"  (1.651 m)  03/23/13 5\' 5"  (1.651 m)   General appearance: alert, cooperative and appears stated age Head: Normocephalic, without obvious abnormality, atraumatic Neck: no adenopathy, no carotid bruit, no JVD, supple, symmetrical, trachea midline and thyroid not enlarged, symmetric, no tenderness/mass/nodules Lungs: clear to auscultation bilaterally Breasts: normal appearance, no masses or tenderness Heart: regular rate and rhythm, S1, S2 normal, no murmur, click, rub or gallop Abdomen: soft, non-tender; bowel sounds normal; no masses,  no organomegaly Extremities: extremities normal, atraumatic, no cyanosis or edema Skin: Skin color, texture, turgor normal. No rashes or lesions Lymph nodes: Cervical, supraclavicular, and axillary nodes normal. no inguinal nodes palpated Neurologic: Grossly normal   Pelvic: External genitalia:  no lesions              Urethra: normal appearing urethra with no masses, tenderness or lesions              Bartholins and  Skenes: normal                 Vagina: normal appearing vagina with normal color and discharge, no lesions              Cervix: normal appearance              Pap taken: No.        Bimanual Exam:  Uterus:  uterus is normal size, shape, consistency and nontender                                      Adnexa:    normal adnexa in size, nontender and no masses                                      Rectovaginal: Confirms                                      Anus:  normal sphincter tone, no lesions  1. Routine gynecological examination counseled on breast self exam, mammography screening,  adequate intake of calcium and vitamin D, diet and exercise return annually or prn   2. Colon cancer screening Referral made, stressed importance of screening  Discussed PAP guideline changes, importance of weight bearing exercises, calcium, vit D and balanced diet.  An After Visit Summary was printed and given to the patient.

## 2014-03-16 NOTE — Patient Instructions (Signed)

## 2014-05-25 DIAGNOSIS — F5101 Primary insomnia: Secondary | ICD-10-CM | POA: Insufficient documentation

## 2014-07-09 ENCOUNTER — Other Ambulatory Visit: Payer: Self-pay | Admitting: Gynecology

## 2014-07-09 DIAGNOSIS — Z1231 Encounter for screening mammogram for malignant neoplasm of breast: Secondary | ICD-10-CM

## 2014-07-23 ENCOUNTER — Encounter: Payer: Self-pay | Admitting: Gynecology

## 2014-07-27 ENCOUNTER — Ambulatory Visit (HOSPITAL_COMMUNITY): Payer: PRIVATE HEALTH INSURANCE

## 2014-08-03 ENCOUNTER — Ambulatory Visit (HOSPITAL_COMMUNITY)
Admission: RE | Admit: 2014-08-03 | Discharge: 2014-08-03 | Disposition: A | Discharge status: Home / Self Care | Payer: No Typology Code available for payment source | Source: Ambulatory Visit | Attending: Gynecology | Admitting: Gynecology

## 2014-08-03 DIAGNOSIS — Z1231 Encounter for screening mammogram for malignant neoplasm of breast: Secondary | ICD-10-CM | POA: Diagnosis present

## 2015-03-22 ENCOUNTER — Ambulatory Visit: Payer: PRIVATE HEALTH INSURANCE | Admitting: Certified Nurse Midwife

## 2015-03-22 ENCOUNTER — Telehealth: Payer: Self-pay | Admitting: Certified Nurse Midwife

## 2015-03-22 ENCOUNTER — Ambulatory Visit: Payer: PRIVATE HEALTH INSURANCE | Admitting: Gynecology

## 2015-03-22 NOTE — Telephone Encounter (Signed)
Patient called and cancelled her AEX today with Leota Sauerseborah Leonard, CNM. She will call back to reschedule. Separate staff message sent to provider.

## 2015-06-14 ENCOUNTER — Ambulatory Visit (INDEPENDENT_AMBULATORY_CARE_PROVIDER_SITE_OTHER): Payer: PRIVATE HEALTH INSURANCE | Admitting: Certified Nurse Midwife

## 2015-06-14 ENCOUNTER — Encounter: Payer: Self-pay | Admitting: Certified Nurse Midwife

## 2015-06-14 VITALS — BP 124/70 | HR 80 | Resp 20 | Ht 63.5 in | Wt 225.0 lb

## 2015-06-14 DIAGNOSIS — Z124 Encounter for screening for malignant neoplasm of cervix: Secondary | ICD-10-CM

## 2015-06-14 DIAGNOSIS — Z01419 Encounter for gynecological examination (general) (routine) without abnormal findings: Secondary | ICD-10-CM

## 2015-06-14 DIAGNOSIS — Z1211 Encounter for screening for malignant neoplasm of colon: Secondary | ICD-10-CM | POA: Diagnosis not present

## 2015-06-14 DIAGNOSIS — Z0000 Encounter for general adult medical examination without abnormal findings: Secondary | ICD-10-CM | POA: Diagnosis not present

## 2015-06-14 NOTE — Patient Instructions (Signed)

## 2015-06-14 NOTE — Progress Notes (Signed)
Reviewed personally.  M. Suzanne Miller, MD.  

## 2015-06-14 NOTE — Progress Notes (Signed)
59 y.o. G39P2002 Divorced  Caucasian Fe here for annual exam. Menopausal no HRT. Denies vaginal bleeding or dryness. Sees PCP for aex/labs and medication management of Hypothyroid, hypertension,and cholesterol. 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 No health issues today.  Patient's last menstrual period was 08/02/2012.          Sexually active: No.  The current method of family planning is post menopausal status.    Exercising: Yes.    horseback riding,farm chores Smoker:  no  Health Maintenance: Pap: 03-02-13 neg HPV HR neg MMG: 08-03-14 category c density,birads 1:neg Colonoscopy: none  BMD:   none TDaP:  2010 Labs: pcp Self breast exam: done occ   reports that she has never smoked. She has never used smokeless tobacco. She reports that she drinks alcohol. She reports that she does not use illicit drugs.  Past Medical History  Diagnosis Date  . Hypothyroidism   . Unspecified hypothyroidism 03/02/2013    Past Surgical History  Procedure Laterality Date  . Rotator cuff repair  20 years ago  . Wisdom tooth extraction  1973    Current Outpatient Prescriptions  Medication Sig Dispense Refill  . ALPRAZolam (XANAX) 0.5 MG tablet Take 0.5 mg by mouth at bedtime.     . cetirizine (ZYRTEC) 10 MG tablet Take 10 mg by mouth at bedtime.    . fluticasone (FLONASE) 50 MCG/ACT nasal spray as needed.     Marland Kitchen levothyroxine (SYNTHROID, LEVOTHROID) 175 MCG tablet Take 175 mcg by mouth daily.  1  . losartan (COZAAR) 100 MG tablet Take 100 mg by mouth.    . simvastatin (ZOCOR) 40 MG tablet Take 40 mg by mouth at bedtime.      No current facility-administered medications for this visit.    Family History  Problem Relation Age of Onset  . Heart Problems Mother   . Heart attack Father     ROS:  Pertinent items are noted in HPI.  Otherwise, a comprehensive ROS was negative.  Exam:   BP 124/70 mmHg  Pulse 80  Resp 20  Ht 5' 3.5" (1.613 m)  Wt 225 lb (102.059 kg)  BMI 39.23  kg/m2  LMP 08/02/2012 Height: 5' 3.5" (161.3 cm) Ht Readings from Last 3 Encounters:  06/14/15 5' 3.5" (1.613 m)  03/16/14 5' 3.75" (1.619 m)  04/20/13  (1.651 m)    General appearance: alert, cooperative and appears stated age Head: Normocephalic, without obvious abnormality, atraumatic Neck: no adenopathy, supple, symmetrical, trachea midline and thyroid normal to inspection and palpation Lungs: clear to auscultation bilaterally Breasts: normal appearance, no masses or tenderness, No nipple retraction or dimpling, No nipple discharge or bleeding, No axillary or supraclavicular adenopathy Heart: regular rate and rhythm Abdomen: soft, non-tender; no masses,  no organomegaly Extremities: extremities normal, atraumatic, no cyanosis or edema Skin: Skin color, texture, turgor normal. No rashes or lesions Lymph nodes: Cervical, supraclavicular, and axillary nodes normal. No abnormal inguinal nodes palpated Neurologic: Grossly normal   Pelvic: External genitalia:  no lesions              Urethra:  normal appearing urethra with no masses, tenderness or lesions              Bartholin's and Skene's: normal                 Vagina: normal appearing vagina with normal color and discharge, no lesions  Cervix: normal,non tender, no lesions              Pap taken: Yes.   Bimanual Exam:  Uterus:  normal size, contour, position, consistency, mobility, non-tender              Adnexa: normal adnexa and no mass, fullness, tenderness               Rectovaginal: Confirms               Anus:  normal sphincter tone, no lesions  Chaperone present: yes  A:  Well Woman with normal exam  Menopausal no HRT  Hypertension,Hypothyroid,cholesterol management with PCP  Colonoscopy due, declines scheduling    P:   Reviewed health and wellness pertinent to exam  Aware of need to evaluate if vaginal bleeding  Continue follow up with PCP as indicated  Discussed risks and benefits of  colonoscopy will advise if decides to scheduled. IFOB dispensed   counseled on breast self exam, mammography screening, adequate intake of calcium and vitamin D, diet and exercise  return annually or prn  An After Visit Summary was printed and given to the patient.

## 2015-06-18 LAB — IPS PAP TEST WITH REFLEX TO HPV

## 2015-07-16 LAB — FECAL OCCULT BLOOD, IMMUNOCHEMICAL: IMMUNOLOGICAL FECAL OCCULT BLOOD TEST: NEGATIVE

## 2015-07-16 NOTE — Addendum Note (Signed)
Addended by: Shelda JakesHANNER, MARTHA E on: 07/16/2015 09:19 AM   Modules accepted: Orders

## 2015-07-23 ENCOUNTER — Other Ambulatory Visit: Payer: Self-pay

## 2015-07-23 DIAGNOSIS — Z1231 Encounter for screening mammogram for malignant neoplasm of breast: Secondary | ICD-10-CM

## 2015-08-06 ENCOUNTER — Ambulatory Visit
Admission: RE | Admit: 2015-08-06 | Discharge: 2015-08-06 | Disposition: A | Discharge status: Home / Self Care | Payer: PRIVATE HEALTH INSURANCE | Source: Ambulatory Visit

## 2015-08-06 DIAGNOSIS — Z1231 Encounter for screening mammogram for malignant neoplasm of breast: Secondary | ICD-10-CM

## 2016-02-13 DIAGNOSIS — I10 Essential (primary) hypertension: Secondary | ICD-10-CM | POA: Insufficient documentation

## 2016-02-13 DIAGNOSIS — E782 Mixed hyperlipidemia: Secondary | ICD-10-CM | POA: Insufficient documentation

## 2016-02-13 HISTORY — DX: Essential (primary) hypertension: I10

## 2016-06-17 ENCOUNTER — Ambulatory Visit: Payer: PRIVATE HEALTH INSURANCE | Admitting: Certified Nurse Midwife

## 2016-07-21 ENCOUNTER — Other Ambulatory Visit: Payer: Self-pay | Admitting: Nurse Practitioner

## 2016-07-21 DIAGNOSIS — Z1231 Encounter for screening mammogram for malignant neoplasm of breast: Secondary | ICD-10-CM

## 2016-08-07 ENCOUNTER — Ambulatory Visit
Admission: RE | Admit: 2016-08-07 | Discharge: 2016-08-07 | Disposition: A | Discharge status: Home / Self Care | Payer: 59 | Source: Ambulatory Visit | Attending: Nurse Practitioner | Admitting: Nurse Practitioner

## 2016-08-07 DIAGNOSIS — Z1231 Encounter for screening mammogram for malignant neoplasm of breast: Secondary | ICD-10-CM

## 2017-07-06 ENCOUNTER — Other Ambulatory Visit: Payer: Self-pay | Admitting: Nurse Practitioner

## 2017-07-06 DIAGNOSIS — Z1231 Encounter for screening mammogram for malignant neoplasm of breast: Secondary | ICD-10-CM

## 2017-08-09 ENCOUNTER — Ambulatory Visit
Admission: RE | Admit: 2017-08-09 | Discharge: 2017-08-09 | Disposition: A | Discharge status: Home / Self Care | Payer: 59 | Source: Ambulatory Visit | Attending: Nurse Practitioner | Admitting: Nurse Practitioner

## 2017-08-09 DIAGNOSIS — Z1231 Encounter for screening mammogram for malignant neoplasm of breast: Secondary | ICD-10-CM

## 2018-06-28 ENCOUNTER — Other Ambulatory Visit: Payer: Self-pay | Admitting: Nurse Practitioner

## 2018-06-28 DIAGNOSIS — Z1231 Encounter for screening mammogram for malignant neoplasm of breast: Secondary | ICD-10-CM

## 2018-08-12 ENCOUNTER — Ambulatory Visit
Admission: RE | Admit: 2018-08-12 | Discharge: 2018-08-12 | Disposition: A | Discharge status: Home / Self Care | Payer: 59 | Source: Ambulatory Visit | Attending: Nurse Practitioner | Admitting: Nurse Practitioner

## 2018-08-12 DIAGNOSIS — Z1231 Encounter for screening mammogram for malignant neoplasm of breast: Secondary | ICD-10-CM

## 2018-11-30 DIAGNOSIS — R931 Abnormal findings on diagnostic imaging of heart and coronary circulation: Secondary | ICD-10-CM | POA: Insufficient documentation

## 2018-12-05 ENCOUNTER — Telehealth: Payer: Self-pay

## 2018-12-05 NOTE — Telephone Encounter (Signed)
NOTES ON FILE 

## 2019-01-26 ENCOUNTER — Telehealth: Payer: Self-pay | Admitting: *Deleted

## 2019-01-26 NOTE — Telephone Encounter (Signed)
Called pt re: virtual visit scheduled for 5/8 with Dr. Gaspar Bidding to VIDEO at 2pm with Dr. Tenny Craw.  USE MOBILE NUMBER: [id number] Sent consent through MyChart

## 2019-01-27 ENCOUNTER — Telehealth (INDEPENDENT_AMBULATORY_CARE_PROVIDER_SITE_OTHER): Payer: 59 | Admitting: Internal Medicine

## 2019-01-27 ENCOUNTER — Encounter: Payer: Self-pay | Admitting: Internal Medicine

## 2019-01-27 ENCOUNTER — Other Ambulatory Visit: Payer: Self-pay

## 2019-01-27 VITALS — BP 145/80 | HR 58 | Ht 63.5 in | Wt 209.0 lb

## 2019-01-27 DIAGNOSIS — I10 Essential (primary) hypertension: Secondary | ICD-10-CM | POA: Diagnosis not present

## 2019-01-27 DIAGNOSIS — I2510 Atherosclerotic heart disease of native coronary artery without angina pectoris: Secondary | ICD-10-CM | POA: Diagnosis not present

## 2019-01-27 DIAGNOSIS — E782 Mixed hyperlipidemia: Secondary | ICD-10-CM | POA: Diagnosis not present

## 2019-01-27 MED ORDER — ASPIRIN EC 81 MG PO TBEC: 81.0000 mg | DELAYED_RELEASE_TABLET | Freq: Every day | ORAL | 3 refills | Status: AC

## 2019-01-27 MED ORDER — AMLODIPINE BESYLATE 2.5 MG PO TABS: 2.5000 mg | ORAL_TABLET | Freq: Every day | ORAL | 3 refills | Status: DC

## 2019-01-27 MED ORDER — ROSUVASTATIN CALCIUM 20 MG PO TABS: 20.0000 mg | ORAL_TABLET | Freq: Every day | ORAL | 3 refills | Status: DC

## 2019-01-27 NOTE — Progress Notes (Signed)
Virtual Visit via Video   Note   This visit type was conducted due to national recommendations for restrictions regarding the COVID-19 Pandemic (e.g. social distancing) in an effort to limit this patient's exposure and mitigate transmission in our community.  Due to her co-morbid illnesses, this patient is at least at moderate risk for complications without adequate follow up.  Th.  All issues noted in this document were discussed and addressed.  No physical exam could be performed with this format.  Please refer to the patient's chart for her  consent to telehealth for [facility].   Date:  01/27/2019   ID:  Monique OlpPaula Johanna [name], DOB 18-Nov-1955, MRN [id number]  Patient Location: Home Provider Location: Home  PCP:  Eartha InchBadger, Michael C, MD [facility] Cardiologist: New Electrophysiologist:  None   Evaluation Performed:  New Patient Evaluation  Chief Complaint:  Abnormal CT calcium score  History of Present Illness:    Monique [name] is a 63 y.o. 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    Past Medical History:  Diagnosis Date  . Hypothyroidism   . Unspecified hypothyroidism 03/02/2013   Past Surgical History:  Procedure Laterality Date  . ROTATOR CUFF REPAIR  20 years ago  . WISDOM TOOTH EXTRACTION  1973     Current Meds  Medication Sig  . ALPRAZolam (XANAX) 0.5 MG tablet Take 0.5 mg by mouth at bedtime.   . cetirizine (ZYRTEC) 10 MG tablet Take 10 mg by mouth at bedtime.  . fluticasone (FLONASE) 50 MCG/ACT nasal spray Place 1 spray into both nostrils as needed for allergies.   Marland Kitchen.  levothyroxine (SYNTHROID) 150 MCG tablet Take 150 mcg by mouth as directed. Mondays, Wednesdays, Fridays  . levothyroxine (SYNTHROID, LEVOTHROID) 175 MCG tablet Take 175 mcg by mouth as directed. Tuesdays, Thursdays, Saturdays, Sundays  . meloxicam (MOBIC) 15 MG tablet Take 15 mg by mouth daily.  . Multiple Vitamin (MULTIVITAMIN) tablet Take 1 tablet by mouth daily.  . simvastatin (ZOCOR) 40 MG tablet Take 40 mg by mouth at bedtime.   . valsartan-hydrochlorothiazide (DIOVAN-HCT) 320-12.5 MG tablet TAKE 1 TABLET BY MOUTH EVERY DAY     Allergies:   Augmentin [amoxicillin-pot clavulanate] and Ace inhibitors   Social History   Tobacco Use  . Smoking status: Never Smoker  . Smokeless tobacco: Never Used  Substance Use Topics  . Alcohol use: Yes    Alcohol/week: 0.0 - 1.0 standard drinks  . Drug use: No     Family Hx: The patient's family history includes Heart Problems in her mother; Heart attack in her father. There is no history of Breast cancer.  ROS:   Please see the history of present illness.    All other systems reviewed and are negative.   Prior CV studies:   The following studies were reviewed today: CT   Labs/Other Tests and Data Reviewed:    EKG:  EKG not done  Tele visit  Recent Labs: No results found for requested labs within last 8760 hours.  Recent Lipid Panel No results found for: CHOL, TRIG, HDL, CHOLHDL, LDLCALC, LDLDIRECT  Wt Readings from Last 3 Encounters:  01/27/19 209 lb (94.8 kg)  06/14/15 225 lb (102.1 kg)  03/16/14 224 lb (101.6 kg)     Objective:    Vital Signs:  BP (!) 145/80   Pulse (!) 58   Ht 5' 3.5" (1.613 m)   Wt 209 lb (94.8 kg)   LMP 08/02/2012   BMI 36.44 kg/m    Vitals reviewed  ASSESSMENT & PLAN:    1. CAD   Picked up on CT calcium score  No symptoms     I would recomm switching statin for more aggressive LDL control   Would also add 81 mg ecASA   WIll follow CBC  2   HL   Needs tighter control of LDL  Was checked by  Dr Tenna Delaine office   LDL was 82   In 2009 her particle number was high    Stop Zocor   Start Crestor 20   F/U lipomed NMR in 10 to 12 wks  3  HTN   WIll add low dose amlodipine 2.5 mg daily  COntinue other meds    Can start at 1.25 and follow   REcomm she use my chart for BP in 1 month  Feedback  Labs in 10 to 12 wks    I will see late summer /early fall  COVID-19 Education: The signs and symptoms of COVID-19 were discussed with the patient and how to seek care for testing (follow up with PCP or arrange E-visit).  The importance of social distancing was discussed today.  Time:   Today, I have spent 25 minutes with the patient with telehealth technology discussing the above problems.     Medication Adjustments/Labs and Tests Ordered: Current medicines are reviewed at length with the patient today.  Concerns regarding medicines are outlined above.   Tests Ordered: No orders of the defined types were placed in this encounter.   Medication Changes: No orders of the defined types were placed in this encounter.   Disposition:  Follow up Aug or September   SignedDietrich Pates, MD  01/27/2019 2:15 PM    [facility]

## 2019-01-27 NOTE — Patient Instructions (Signed)
Medication Instructions:  Your physician has recommended you make the following change in your medication:  1.) stop simvastatin 2.) start rosuvastatin (Crestor) 20 mg once a day for cholesterol 3.) start amlodipine (Norvasc) 2.5 mg once a day for blood pressure 4.) start baby aspirin, enteric coated, not chewable --aspirin EC 81 mg once  day If you need a refill on your cardiac medications before your next appointment, please call your pharmacy.   Lab work: Around the end of July --lipomed profile  If you have labs (blood work) drawn today and your tests are completely normal, you will receive your results only by: Marland Kitchen MyChart Message (if you have MyChart) OR . A paper copy in the mail If you have any lab test that is abnormal or we need to change your treatment, we will call you to review the results.  Testing/Procedures: none  Follow-Up: At [facility], you and your health needs are our priority.  As part of our continuing mission to provide you with exceptional heart care, we have created designated Provider Care Teams.  These Care Teams include your primary Cardiologist (physician) and Advanced Practice Providers (APPs -  Physician Assistants and Nurse Practitioners) who all work together to provide you with the care you need, when you need it. You will need a follow up appointment in:  3-4 months.  Please call our office 2 months in advance to schedule this appointment.  You may see Dr. Tenny Craw or one of the following Advanced Practice Providers on your designated Care Team: Tereso Newcomer, PA-C [place], [place] . Berton Bon, NP  Any Other Special Instructions Will Be Listed Below (If Applicable).

## 2019-01-27 NOTE — Addendum Note (Signed)
Addended by: Lendon Ka on: 01/27/2019 03:34 PM   Modules accepted: Orders

## 2019-04-17 ENCOUNTER — Other Ambulatory Visit: Payer: 59 | Admitting: *Deleted

## 2019-04-17 ENCOUNTER — Other Ambulatory Visit: Payer: Self-pay

## 2019-04-17 DIAGNOSIS — I10 Essential (primary) hypertension: Secondary | ICD-10-CM

## 2019-04-17 DIAGNOSIS — E782 Mixed hyperlipidemia: Secondary | ICD-10-CM

## 2019-04-17 DIAGNOSIS — I2510 Atherosclerotic heart disease of native coronary artery without angina pectoris: Secondary | ICD-10-CM

## 2019-04-18 LAB — NMR, LIPOPROFILE
Cholesterol, Total: 120 mg/dL (ref 100–199)
HDL Particle Number: 32.2 umol/L (ref >=30.5)
HDL-C: 44 mg/dL (ref >39)
LDL Particle Number: 711 nmol/L (ref <1000)
LDL Size: 20.9 nm (ref >20.5)
LDL-C: 60 mg/dL (ref 0–99)
LP-IR Score: 52 — ABNORMAL HIGH (ref <=45)
Small LDL Particle Number: 404 nmol/L (ref <=527)
Triglycerides: 81 mg/dL (ref 0–149)

## 2019-04-18 LAB — LIPOPROTEIN A (LPA): Lipoprotein (a): 43.2 nmol/L (ref <75.0)

## 2019-04-18 LAB — APOLIPOPROTEIN B: Apolipoprotein B: 63 mg/dL (ref <90)

## 2019-05-21 NOTE — Progress Notes (Signed)
Cardiology Office Note   Date:  05/22/2019   ID:  Monique [name], DOB May 26, 1956, MRN [id number]  PCP:  Eartha InchBadger, Michael C, MD  Cardiologist:   Dietrich PatesPaula Ross, MD   F/U of CAD and HTN     History of Present Illness: Monique [name] is a 63 y.o. female with a history of COronary caclifications on CT scan in LM, LAD, RCA  Ca score 110   I saw the pt in a televisit in May 2020 At time I recomm adding low dose amlodipine to regimen  SInce seen she has done well   Denies CP  Breathing is good   No dizziness  No edema       Current Meds  Medication Sig  . ALPRAZolam (XANAX) 0.5 MG tablet Take 0.5 mg by mouth at bedtime.   Marland Kitchen. amLODipine (NORVASC) 2.5 MG tablet Take 1 tablet (2.5 mg total) by mouth daily.  Marland Kitchen. aspirin EC 81 MG tablet Take 1 tablet (81 mg total) by mouth daily.  . cetirizine (ZYRTEC) 10 MG tablet Take 10 mg by mouth at bedtime.  . fluticasone (FLONASE) 50 MCG/ACT nasal spray Place 1 spray into both nostrils as needed for allergies.   Marland Kitchen. levothyroxine (SYNTHROID) 150 MCG tablet Take 150 mcg by mouth as directed. Mondays, Wednesdays, Fridays  . levothyroxine (SYNTHROID, LEVOTHROID) 175 MCG tablet Take 175 mcg by mouth as directed. Tuesdays, Thursdays, Saturdays, Sundays  . meloxicam (MOBIC) 15 MG tablet Take 15 mg by mouth daily.  . Multiple Vitamin (MULTIVITAMIN) tablet Take 1 tablet by mouth daily.  . rosuvastatin (CRESTOR) 20 MG tablet Take 1 tablet (20 mg total) by mouth daily.  . valsartan-hydrochlorothiazide (DIOVAN-HCT) 320-12.5 MG tablet TAKE 1 TABLET BY MOUTH EVERY DAY     Allergies:   Augmentin [amoxicillin-pot clavulanate] and Ace inhibitors   Past Medical History:  Diagnosis Date  . Hypothyroidism   . Unspecified hypothyroidism 03/02/2013    Past Surgical History:  Procedure Laterality Date  . ROTATOR CUFF REPAIR  20 years ago  . WISDOM TOOTH EXTRACTION  1973     Social History:  The patient  reports that she has never smoked. She has never  used smokeless tobacco. She reports current alcohol use. She reports that she does not use drugs.   Family History:  The patient's family history includes Heart Problems in her mother; Heart attack in her father.    ROS:  Please see the history of present illness. All other systems are reviewed and  Negative to the above problem except as noted.    PHYSICAL EXAM: VS:  BP (!) 110/56   Pulse 61   Ht 5' 3.5" (1.613 m)   Wt 214 lb (97.1 kg)   LMP 08/02/2012   SpO2 98%   BMI 37.31 kg/m   GEN morbidly obese 63 yo  in no acute distress  HEENT: normal  Neck: no JVD, carotid bruits, or masses Cardiac: RRR; no murmurs, rubs, or gallops,no edema  Respiratory:  clear to auscultation bilaterally, normal work of breathing GI: soft, nontender, nondistended, + BS  No hepatomegaly  MS: no deformity Moving all extremities   Skin: warm and dry, no rash Neuro:  Strength and sensation are intact Psych: euthymic mood, full affect   EKG:  EKG is not ordered today.  In Feb 2020   SB     Lipid Panel No results found for: CHOL, TRIG, HDL, CHOLHDL, VLDL, LDLCALC, LDLDIRECT    Wt Readings from Last 3 Encounters:  05/22/19 214 lb (97.1 kg)  01/27/19 209 lb (94.8 kg)  06/14/15 225 lb (102.1 kg)      ASSESSMENT AND PLAN:  1  CAD Coronary calcifications noted on CT  Ca score of 110   On ASA   Optimized lipids   BP is good now    Asymptomatic      2  HL  LIpdis are very good  LDL 70  HDL44  LDL particles are 711  3  HTN  BP is good on very low dose amlodipine   Denies dizziness  No swelling    F/U in 1 year    Current medicines are reviewed at length with the patient today.  The patient does not have concerns regarding medicines.  Signed, Dorris Carnes, MD  05/22/2019 8:33 AM    [facility] [place], [place], [place]  [address] Phone: [phone]; Fax: [phone]

## 2019-05-22 ENCOUNTER — Encounter: Payer: Self-pay | Admitting: Internal Medicine

## 2019-05-22 ENCOUNTER — Ambulatory Visit: Payer: 59 | Admitting: Internal Medicine

## 2019-05-22 ENCOUNTER — Other Ambulatory Visit: Payer: Self-pay

## 2019-05-22 VITALS — BP 110/56 | HR 61 | Ht 63.5 in | Wt 214.0 lb

## 2019-05-22 DIAGNOSIS — I2510 Atherosclerotic heart disease of native coronary artery without angina pectoris: Secondary | ICD-10-CM | POA: Diagnosis not present

## 2019-05-22 DIAGNOSIS — E782 Mixed hyperlipidemia: Secondary | ICD-10-CM

## 2019-05-22 DIAGNOSIS — I10 Essential (primary) hypertension: Secondary | ICD-10-CM

## 2019-05-22 NOTE — Patient Instructions (Signed)
Medication Instructions:  No changes If you need a refill on your cardiac medications before your next appointment, please call your pharmacy.   Lab work: none If you have labs (blood work) drawn today and your tests are completely normal, you will receive your results only by: . MyChart Message (if you have MyChart) OR . A paper copy in the mail If you have any lab test that is abnormal or we need to change your treatment, we will call you to review the results.  Testing/Procedures: none  Follow-Up: At [facility], you and your health needs are our priority.  As part of our continuing mission to provide you with exceptional heart care, we have created designated Provider Care Teams.  These Care Teams include your primary Cardiologist (physician) and Advanced Practice Providers (APPs -  Physician Assistants and Nurse Practitioners) who all work together to provide you with the care you need, when you need it. You will need a follow up appointment in:  12 months.  Please call our office 2 months in advance to schedule this appointment.  You may see Kayleanna Ross, MD or one of the following Advanced Practice Providers on your designated Care Team: Scott Weaver, PA-C Vin Bhagat, PA-C . Janine Hammond, NP  Any Other Special Instructions Will Be Listed Below (If Applicable).    

## 2019-07-10 ENCOUNTER — Other Ambulatory Visit: Payer: Self-pay | Admitting: Nurse Practitioner

## 2019-07-10 DIAGNOSIS — Z1231 Encounter for screening mammogram for malignant neoplasm of breast: Secondary | ICD-10-CM

## 2019-08-25 ENCOUNTER — Other Ambulatory Visit: Payer: Self-pay

## 2019-08-25 ENCOUNTER — Ambulatory Visit
Admission: RE | Admit: 2019-08-25 | Discharge: 2019-08-25 | Disposition: A | Discharge status: Home / Self Care | Payer: 59 | Source: Ambulatory Visit | Attending: Nurse Practitioner | Admitting: Nurse Practitioner

## 2019-08-25 DIAGNOSIS — Z1231 Encounter for screening mammogram for malignant neoplasm of breast: Secondary | ICD-10-CM

## 2019-10-19 DIAGNOSIS — I2510 Atherosclerotic heart disease of native coronary artery without angina pectoris: Secondary | ICD-10-CM | POA: Insufficient documentation

## 2020-01-09 ENCOUNTER — Other Ambulatory Visit: Payer: Self-pay | Admitting: Internal Medicine

## 2020-05-23 NOTE — Progress Notes (Signed)
Cardiology Office Note   Date:  05/24/2020   ID:  Monique, [name] December 19, 1955, MRN [id number]  PCP:  Monique Inch, MD  Cardiologist:   Dietrich Pates, MD   F/U of CAD and HTN     History of Present Illness: Monique [name] is a 64 y.o. female with a history of COronary caclifications on CT scan in LM, LAD, RCA  Ca score 110    I saw the pt in Aug 2020  Since seen she has done well  No CP  Breathing is OK  No dizziness  Active   Current Meds  Medication Sig  . ALPRAZolam (XANAX) 0.5 MG tablet Take 0.5 mg by mouth at bedtime.   Marland Kitchen amLODipine (NORVASC) 2.5 MG tablet TAKE 1 TABLET BY MOUTH EVERY DAY  . aspirin EC 81 MG tablet Take 1 tablet (81 mg total) by mouth daily.  . cetirizine (ZYRTEC) 10 MG tablet Take 10 mg by mouth at bedtime.  . fluticasone (FLONASE) 50 MCG/ACT nasal spray Place 1 spray into both nostrils as needed for allergies.   Marland Kitchen levothyroxine (SYNTHROID) 150 MCG tablet Take 150 mcg by mouth as directed. Mondays, Wednesdays, Fridays  . levothyroxine (SYNTHROID, LEVOTHROID) 175 MCG tablet Take 175 mcg by mouth as directed. Tuesdays, Thursdays, Saturdays, Sundays  . meloxicam (MOBIC) 15 MG tablet Take 15 mg by mouth daily.  . Multiple Vitamin (MULTIVITAMIN) tablet Take 1 tablet by mouth daily.  . rosuvastatin (CRESTOR) 20 MG tablet TAKE 1 TABLET BY MOUTH EVERY DAY  . valsartan-hydrochlorothiazide (DIOVAN-HCT) 320-12.5 MG tablet TAKE 1 TABLET BY MOUTH EVERY DAY     Allergies:   Augmentin [amoxicillin-pot clavulanate] and Ace inhibitors   Past Medical History:  Diagnosis Date  . Hypothyroidism   . Unspecified hypothyroidism 03/02/2013    Past Surgical History:  Procedure Laterality Date  . ROTATOR CUFF REPAIR  20 years ago  . WISDOM TOOTH EXTRACTION  1973     Social History:  The patient  reports that she has never smoked. She has never used smokeless tobacco. She reports current alcohol use. She reports that she does not use drugs.   Family  History:  The patient's family history includes Heart Problems in her mother; Heart attack in her father.    ROS:  Please see the history of present illness. All other systems are reviewed and  Negative to the above problem except as noted.    PHYSICAL EXAM: VS:  BP 124/78   Pulse (!) 52   Ht 5' 3.5" (1.613 m)   Wt 223 lb (101.2 kg)   LMP 08/02/2012   SpO2 96%   BMI 38.88 kg/m   GEN morbidly obese 64 yo  in no acute distress  HEENT: normal  Neck: no JVD, carotid bruits Cardiac: RRR; no murmurs   No LE  edema  Respiratory:  clear to auscultation bilaterally, GI: soft, nontender, nondistended, + BS  No hepatomegaly  MS: no deformity Moving all extremities   Skin: warm and dry, no rash Neuro:  Strength and sensation are intact Psych: euthymic mood, full affect   EKG:  EKG is ordered  SB 52 bpm    Lipid Panel No results found for: CHOL, TRIG, HDL, CHOLHDL, VLDL, LDLCALC, LDLDIRECT    Wt Readings from Last 3 Encounters:  05/24/20 223 lb (101.2 kg)  05/22/19 214 lb (97.1 kg)  01/27/19 209 lb (94.8 kg)      ASSESSMENT AND PLAN:  1  CAD Coronary calcifications noted  on CT  Ca score of 110   On ASA   Doing well  No symptoms  She remains active    2  HL  Last lipids in July LDL was 78  Has been better     Will follow  Watch diet    3  HTN  =Controlled      4  HCM  Discussed diet   Cutting back on carbs   Trying to get period of no food intake   Between getting to work early and working late with horses she has early breakfast and late dinner though   Watch carbs  F/U in 1 year    Current medicines are reviewed at length with the patient today.  The patient does not have concerns regarding medicines.  Signed, Dietrich Pates, MD  05/24/2020 10:33 AM    [facility] [address] [place], [place], [place]  [address] Phone: [phone]; Fax: [phone]

## 2020-05-24 ENCOUNTER — Other Ambulatory Visit: Payer: Self-pay

## 2020-05-24 ENCOUNTER — Ambulatory Visit: Payer: 59 | Admitting: Internal Medicine

## 2020-05-24 ENCOUNTER — Encounter: Payer: Self-pay | Admitting: Internal Medicine

## 2020-05-24 VITALS — BP 124/78 | HR 52 | Ht 63.5 in | Wt 223.0 lb

## 2020-05-24 DIAGNOSIS — I2510 Atherosclerotic heart disease of native coronary artery without angina pectoris: Secondary | ICD-10-CM | POA: Diagnosis not present

## 2020-05-24 NOTE — Patient Instructions (Signed)
Medication Instructions:  No changes *If you need a refill on your cardiac medications before your next appointment, please call your pharmacy*   Lab Work: none If you have labs (blood work) drawn today and your tests are completely normal, you will receive your results only by: Marland Kitchen MyChart Message (if you have MyChart) OR . A paper copy in the mail If you have any lab test that is abnormal or we need to change your treatment, we will call you to review the results.   Testing/Procedures: none   Follow-Up: At [facility], you and your health needs are our priority.  As part of our continuing mission to provide you with exceptional heart care, we have created designated Provider Care Teams.  These Care Teams include your primary Cardiologist (physician) and Advanced Practice Providers (APPs -  Physician Assistants and Nurse Practitioners) who all work together to provide you with the care you need, when you need it.  Your next appointment:   12-14 month(s)  The format for your next appointment:   In Person  Provider:   You may see Dietrich Pates, MD or one of the following Advanced Practice Providers on your designated Care Team:    Tereso Newcomer, PA-C  Chelsea Aus, [place]    Other Instructions

## 2020-07-07 ENCOUNTER — Other Ambulatory Visit: Payer: Self-pay | Admitting: Internal Medicine

## 2020-08-19 ENCOUNTER — Other Ambulatory Visit: Payer: Self-pay | Admitting: Nurse Practitioner

## 2020-08-19 DIAGNOSIS — Z1231 Encounter for screening mammogram for malignant neoplasm of breast: Secondary | ICD-10-CM

## 2020-08-27 ENCOUNTER — Ambulatory Visit
Admission: RE | Admit: 2020-08-27 | Discharge: 2020-08-27 | Disposition: A | Discharge status: Home / Self Care | Payer: 59 | Source: Ambulatory Visit

## 2020-08-27 ENCOUNTER — Other Ambulatory Visit: Payer: Self-pay

## 2020-08-27 DIAGNOSIS — Z1231 Encounter for screening mammogram for malignant neoplasm of breast: Secondary | ICD-10-CM

## 2021-07-02 ENCOUNTER — Other Ambulatory Visit: Payer: Self-pay | Admitting: Internal Medicine

## 2021-07-21 ENCOUNTER — Other Ambulatory Visit: Payer: Self-pay | Admitting: Nurse Practitioner

## 2021-07-21 DIAGNOSIS — Z1231 Encounter for screening mammogram for malignant neoplasm of breast: Secondary | ICD-10-CM

## 2021-07-22 NOTE — Progress Notes (Signed)
Cardiology Office Note    Date:  07/29/2021   ID:  Monique [name], [name] [birth year], MRN [id number]   PCP:  Eartha Inch, MD   [facility]  Cardiologist:  Dietrich Pates, MD   Advanced Practice Provider:  No care team member to display Electrophysiologist:  None   [phone]   Chief Complaint  Patient presents with   Follow-up     History of Present Illness:  Monique [name] is a 65 y.o. female with history of hypertension, HLD, CAD and, family history of early CAD-father and his brother MI's 43's, died in 56,  calcium score of 110 and coronary calcifications on CT scanning left main, LAD, RCA.  Last saw Dr. Tenny Craw 05/2020 and weight loss discussed.  Patient comes in for yearly f/u. 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 Labs reviewed through [facility].  Past Medical History:  Diagnosis Date   Hypothyroidism    Unspecified hypothyroidism 03/02/2013    Past Surgical History:  Procedure Laterality Date   ROTATOR CUFF REPAIR  20 years ago   WISDOM TOOTH EXTRACTION  1973    Current Medications: Current Meds  Medication Sig   ALPRAZolam (XANAX) 0.5 MG tablet Take 0.5 mg by mouth at bedtime.    amLODipine (NORVASC) 2.5 MG tablet TAKE 1 TABLET BY MOUTH EVERY DAY   aspirin EC 81 MG tablet Take 1 tablet (81 mg total) by mouth daily.   fluticasone (FLONASE) 50 MCG/ACT nasal spray Place 1 spray into both nostrils as needed for allergies.    levothyroxine (SYNTHROID, LEVOTHROID) 175 MCG tablet Take 175 mcg by mouth as directed. Tuesdays, Thursdays, Saturdays, Sundays   meloxicam (MOBIC) 15 MG tablet Take 15 mg by mouth daily.   Multiple Vitamin (MULTIVITAMIN) tablet Take 1 tablet by mouth daily.   rosuvastatin (CRESTOR) 20 MG tablet TAKE 1 TABLET BY MOUTH EVERY DAY   valsartan-hydrochlorothiazide (DIOVAN-HCT) 320-12.5 MG tablet TAKE 1 TABLET BY MOUTH EVERY DAY     Allergies:   Augmentin [amoxicillin-pot clavulanate] and Ace inhibitors    Social History   Socioeconomic History   Marital status: Divorced    Spouse name: Not on file   Number of children: Not on file   Years of education: Not on file   Highest education level: Not on file  Occupational History   Not on file  Tobacco Use   Smoking status: Never   Smokeless tobacco: Never  Substance and Sexual Activity   Alcohol use: Yes    Alcohol/week: 0.0 - 1.0 standard drinks   Drug use: No   Sexual activity: Never    Partners: Male    Birth control/protection: Post-menopausal  Other Topics Concern   Not on file  Social History Narrative   Not on file   Social Determinants of Health   Financial Resource Strain: Not on file  Food Insecurity: Not on file  Transportation Needs: Not on file  Physical Activity: Not on file  Stress: Not on file  Social Connections: Not on file     Family History:  The patient's  family history includes Heart Problems in her mother; Heart attack in her father.   ROS:   Please see the history of present illness.    ROS All other systems reviewed and are negative.   PHYSICAL EXAM:   VS:  BP 114/74   Pulse (!) 57   Ht 5\' 4"  (1.626 m)   Wt 218 lb 9.6 oz (99.2 kg)  LMP 08/02/2012   SpO2 96%   BMI 37.52 kg/m   Physical Exam  GEN: Obese, in no acute distress  Neck: no JVD, carotid bruits, or masses Cardiac:RRR; no murmurs, rubs, or gallops  Respiratory:  clear to auscultation bilaterally, normal work of breathing GI: soft, nontender, nondistended, + BS Ext: without cyanosis, clubbing, or edema, Good distal pulses bilaterally Neuro:  Alert and Oriented x 3 Psych: euthymic mood, full affect  Wt Readings from Last 3 Encounters:  07/29/21 218 lb 9.6 oz (99.2 kg)  05/24/20 223 lb (101.2 kg)  05/22/19 214 lb (97.1 kg)      Studies/Labs Reviewed:   EKG:  EKG is  ordered today.  The ekg ordered today demonstrates NSR normal EKG  Recent Labs: No results found for requested labs within last 8760 hours.   Lipid  Panel No results found for: CHOL, TRIG, HDL, CHOLHDL, VLDL, LDLCALC, LDLDIRECT  Additional studies/ records that were reviewed today include:      Risk Assessment/Calculations:         ASSESSMENT:    1. Coronary artery disease involving native coronary artery of native heart without angina pectoris   2. Essential hypertension   3. Pure hypercholesterolemia   4. Family history of early CAD   5. Obesity (BMI 35.0-39.9 without comorbidity)      PLAN:  In order of problems listed above:  CAD with coronary calcifications noted on CT, calcium score of 110, on aspirin, Crestor.  Doing well without chest pain  Hypertension controlled with valsartan hydrochlorothiazide and amlodipine  HLD LDL close to goal at 72 03/2021 on Crestor  Family history of early CAD with father and uncle having MIs in their 82s and dying in their 70s  Obesity heart healthy diet and weight loss recommended.  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  Shared Decision Making/Informed Consent        Medication Adjustments/Labs and Tests Ordered: Current medicines are reviewed at length with the patient today.  Concerns regarding medicines are outlined above.  Medication changes, Labs and Tests ordered today are listed in the Patient Instructions below. Patient Instructions  Medication Instructions:  Your physician recommends that you continue on your current medications as directed. Please refer to the Current Medication list given to you today.  *If you need a refill on your cardiac medications before your next appointment, please call your pharmacy*   Lab Work: None If you have labs (blood work) drawn today and your tests are completely normal, you will receive your results only by: MyChart Message (if you have MyChart) OR A paper copy in the mail If you have any lab test that is abnormal or we need to change your treatment, we will call you to review the  results.    Follow-Up: At [facility], you and your health needs are our priority.  As part of our continuing mission to provide you with exceptional heart care, we have created designated Provider Care Teams.  These Care Teams include your primary Cardiologist (physician) and Advanced Practice Providers (APPs -  Physician Assistants and Nurse Practitioners) who all work together to provide you with the care you need, when you need it.  Your next appointment:   1 year(s)  The format for your next appointment:   In Person  Provider:   Dietrich Pates, MD {    Signed, Jacolyn Reedy, PA-C  07/29/2021 9:39 AM    [facility] [address] [place],  [place], [place]  [address] Phone: [phone]; Fax: [phone]

## 2021-07-29 ENCOUNTER — Other Ambulatory Visit: Payer: Self-pay

## 2021-07-29 ENCOUNTER — Encounter: Payer: Self-pay | Admitting: Physician Assistant

## 2021-07-29 ENCOUNTER — Ambulatory Visit (INDEPENDENT_AMBULATORY_CARE_PROVIDER_SITE_OTHER): Payer: Medicare Other | Admitting: Physician Assistant

## 2021-07-29 VITALS — BP 114/74 | HR 57 | Ht 64.0 in | Wt 218.6 lb

## 2021-07-29 DIAGNOSIS — E7800 Pure hypercholesterolemia, unspecified: Secondary | ICD-10-CM | POA: Diagnosis not present

## 2021-07-29 DIAGNOSIS — E669 Obesity, unspecified: Secondary | ICD-10-CM

## 2021-07-29 DIAGNOSIS — I2510 Atherosclerotic heart disease of native coronary artery without angina pectoris: Secondary | ICD-10-CM

## 2021-07-29 DIAGNOSIS — Z8249 Family history of ischemic heart disease and other diseases of the circulatory system: Secondary | ICD-10-CM | POA: Diagnosis not present

## 2021-07-29 DIAGNOSIS — I10 Essential (primary) hypertension: Secondary | ICD-10-CM | POA: Diagnosis not present

## 2021-07-29 NOTE — Patient Instructions (Signed)
Medication Instructions:  Your physician recommends that you continue on your current medications as directed. Please refer to the Current Medication list given to you today.  *If you need a refill on your cardiac medications before your next appointment, please call your pharmacy*   Lab Work: None If you have labs (blood work) drawn today and your tests are completely normal, you will receive your results only by: MyChart Message (if you have MyChart) OR A paper copy in the mail If you have any lab test that is abnormal or we need to change your treatment, we will call you to review the results.    Follow-Up: At [facility], you and your health needs are our priority.  As part of our continuing mission to provide you with exceptional heart care, we have created designated Provider Care Teams.  These Care Teams include your primary Cardiologist (physician) and Advanced Practice Providers (APPs -  Physician Assistants and Nurse Practitioners) who all work together to provide you with the care you need, when you need it.  Your next appointment:   1 year(s)  The format for your next appointment:   In Person  Provider:   Dietrich Pates, MD {

## 2021-08-28 ENCOUNTER — Ambulatory Visit
Admission: RE | Admit: 2021-08-28 | Discharge: 2021-08-28 | Disposition: A | Discharge status: Home / Self Care | Payer: Medicare Other | Source: Ambulatory Visit | Attending: Nurse Practitioner | Admitting: Nurse Practitioner

## 2021-08-28 DIAGNOSIS — Z1231 Encounter for screening mammogram for malignant neoplasm of breast: Secondary | ICD-10-CM

## 2021-10-01 ENCOUNTER — Other Ambulatory Visit: Payer: Self-pay | Admitting: Internal Medicine

## 2022-07-24 NOTE — Progress Notes (Signed)
Cardiology Office Note   Date:  07/28/2022   ID:  Monique, [name] [birth year], MRN [id number]  PCP:  Eartha Inch, MD  Cardiologist:   Dietrich Pates, MD   F/U of CAD and HTN     History of Present Illness: Monique [name] is a 66 y.o. 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    amLODipine (NORVASC) 2.5 MG tablet TAKE 1 TABLET BY MOUTH EVERY DAY   aspirin EC 81 MG tablet Take 1 tablet (81 mg total) by mouth daily.   fluticasone (FLONASE) 50 MCG/ACT nasal spray Place 1 spray into both nostrils as needed for allergies.    levothyroxine (SYNTHROID) 150 MCG tablet Take 150 mcg by mouth as directed. Mondays, Wednesdays, Fridays   meloxicam (MOBIC) 15 MG tablet Take 15 mg by mouth daily.   Multiple Vitamin (MULTIVITAMIN) tablet Take 1 tablet by mouth daily.   rosuvastatin (CRESTOR) 20 MG tablet TAKE 1 TABLET BY MOUTH EVERY DAY   valsartan-hydrochlorothiazide (DIOVAN-HCT) 320-12.5 MG tablet TAKE 1 TABLET BY MOUTH EVERY DAY     Allergies:   Augmentin [amoxicillin-pot clavulanate] and Ace inhibitors   Past Medical History:  Diagnosis Date   Hypothyroidism    Unspecified hypothyroidism 03/02/2013    Past Surgical History:  Procedure Laterality Date   ROTATOR CUFF REPAIR  20 years ago   WISDOM TOOTH EXTRACTION  1973     Social History:  The patient  reports that she has never smoked. She has never used smokeless tobacco. She reports current alcohol use. She reports that she does not use drugs.   Family History:  The patient's  family history includes Heart Problems in her mother; Heart attack in her father.    ROS:  Please see the history of present illness. All other systems are reviewed and  Negative to the above problem except as noted.    PHYSICAL EXAM: VS:  BP 126/70   Pulse (!) 57   Ht 5\' 4"  (1.626 m)   Wt 219 lb 6.4 oz (99.5 kg)   LMP 08/02/2012   SpO2 99%   BMI 37.66 kg/m   GEN morbidly obese 66 yo  in no acute distress  HEENT: normal  Neck: no JVD, No carotid bruits Cardiac: RRR; no murmurs   No edema in legs  Respiratory:  clear to auscultation bilaterally, GI: soft, nontender, nondistended, + BS MS: no deformity Moving all extremities   Skin: warm and dry, no rash Neuro:  Strength and sensation are intact Psych: euthymic mood, full affect   EKG:  EKG is ordered  SB 57 bpm    Lipid Panel No results found for: "CHOL", "TRIG", "HDL", "CHOLHDL", "VLDL", "LDLCALC", "LDLDIRECT"    Wt Readings from Last 3 Encounters:  07/28/22 219 lb 6.4 oz (99.5 kg)  07/29/21 218 lb 9.6 oz (99.2 kg)  05/24/20 223 lb (101.2 kg)      ASSESSMENT AND PLAN:  1  CAD Coronary calcifications noted on CT  Ca score of 110   On ASA  She denies CP  Breathing is OK     Active 2  HL  Last lipids LDL wasd  72   Work on diet      3  HTN BP is controlled  Continue meds     4  HCM  Increase veggies      F/U in 1 year    Current medicines are reviewed at length with the patient today.  The patient does not have concerns regarding medicines.  Signed, Dietrich Pates, MD  07/28/2022 11:49 AM    [facility] [address] [place], [place], [place]  [address] Phone: [phone]; Fax: [phone]

## 2022-07-27 ENCOUNTER — Other Ambulatory Visit: Payer: Self-pay | Admitting: Nurse Practitioner

## 2022-07-27 DIAGNOSIS — Z780 Asymptomatic menopausal state: Secondary | ICD-10-CM

## 2022-07-28 ENCOUNTER — Encounter: Payer: Self-pay | Admitting: Internal Medicine

## 2022-07-28 ENCOUNTER — Ambulatory Visit: Payer: Medicare Other | Attending: Internal Medicine | Admitting: Internal Medicine

## 2022-07-28 VITALS — BP 126/70 | HR 57 | Ht 64.0 in | Wt 219.4 lb

## 2022-07-28 DIAGNOSIS — I2510 Atherosclerotic heart disease of native coronary artery without angina pectoris: Secondary | ICD-10-CM | POA: Insufficient documentation

## 2022-07-28 NOTE — Patient Instructions (Signed)
Medication Instructions:   *If you need a refill on your cardiac medications before your next appointment, please call your pharmacy*   Lab Work:  If you have labs (blood work) drawn today and your tests are completely normal, you will receive your results only by: MyChart Message (if you have MyChart) OR A paper copy in the mail If you have any lab test that is abnormal or we need to change your treatment, we will call you to review the results.   Testing/Procedures:    Follow-Up: At [facility], you and your health needs are our priority.  As part of our continuing mission to provide you with exceptional heart care, we have created designated Provider Care Teams.  These Care Teams include your primary Cardiologist (physician) and Advanced Practice Providers (APPs -  Physician Assistants and Nurse Practitioners) who all work together to provide you with the care you need, when you need it.  We recommend signing up for the patient portal called "MyChart".  Sign up information is provided on this After Visit Summary.  MyChart is used to connect with patients for Virtual Visits (Telemedicine).  Patients are able to view lab/test results, encounter notes, upcoming appointments, etc.  Non-urgent messages can be sent to your provider as well.   To learn more about what you can do with MyChart, go to [email or web address].    Your next appointment:   1 year(s)  The format for your next appointment:   In Person  Provider:   Ferrell Ross, MD     Other Instructions   Important Information About Sugar       

## 2022-07-29 ENCOUNTER — Other Ambulatory Visit: Payer: Self-pay | Admitting: Family Medicine

## 2022-07-29 DIAGNOSIS — Z1231 Encounter for screening mammogram for malignant neoplasm of breast: Secondary | ICD-10-CM

## 2022-08-22 ENCOUNTER — Ambulatory Visit (INDEPENDENT_AMBULATORY_CARE_PROVIDER_SITE_OTHER): Payer: Medicare Other

## 2022-08-22 ENCOUNTER — Ambulatory Visit (INDEPENDENT_AMBULATORY_CARE_PROVIDER_SITE_OTHER): Payer: Medicare Other | Admitting: Podiatry

## 2022-08-22 DIAGNOSIS — B351 Tinea unguium: Secondary | ICD-10-CM

## 2022-08-22 DIAGNOSIS — M79671 Pain in right foot: Secondary | ICD-10-CM | POA: Diagnosis not present

## 2022-08-22 DIAGNOSIS — I2510 Atherosclerotic heart disease of native coronary artery without angina pectoris: Secondary | ICD-10-CM

## 2022-08-22 DIAGNOSIS — S90211A Contusion of right great toe with damage to nail, initial encounter: Secondary | ICD-10-CM | POA: Diagnosis not present

## 2022-08-22 DIAGNOSIS — Z79899 Other long term (current) drug therapy: Secondary | ICD-10-CM | POA: Diagnosis not present

## 2022-08-22 MED ORDER — TERBINAFINE HCL 250 MG PO TABS: 250.0000 mg | ORAL_TABLET | Freq: Every day | ORAL | 0 refills | Status: DC

## 2022-08-22 NOTE — Patient Instructions (Signed)
Terbinafine Tablets What is this medication? TERBINAFINE (TER bin a feen) treats fungal infections of the nails. It belongs to a group of medications called antifungals. It will not treat infections caused by bacteria or viruses. This medicine may be used for other purposes; ask your health care provider or pharmacist if you have questions. COMMON BRAND NAME(S): Lamisil, Terbinex What should I tell my care team before I take this medication? They need to know if you have any of these conditions: Liver disease An unusual or allergic reaction to terbinafine, other medications, foods, dyes, or preservatives Pregnant or trying to get pregnant Breast-feeding How should I use this medication? Take this medication by mouth with water. Take it as directed on the prescription label at the same time every day. You can take it with or without food. If it upsets your stomach, take it with food. Keep taking it unless your care team tells you to stop. A special MedGuide will be given to you by the pharmacist with each prescription and refill. Be sure to read this information carefully each time. Talk to your care team regarding the use of this medication in children. Special care may be needed. Overdosage: If you think you have taken too much of this medicine contact a poison control center or emergency room at once. NOTE: This medicine is only for you. Do not share this medicine with others. What if I miss a dose? If you miss a dose, take it as soon as you can unless it is more than 4 hours late. If it is more than 4 hours late, skip the missed dose. Take the next dose at the normal time. What may interact with this medication? Do not take this medication with any of the following: Pimozide Thioridazine This medication may also interact with the following: Beta blockers Caffeine Certain medications for mental health conditions Cimetidine Cyclosporine Medications for fungal infections like fluconazole  and ketoconazole Medications for irregular heartbeat like amiodarone, flecainide and propafenone Rifampin Warfarin This list may not describe all possible interactions. Give your health care provider a list of all the medicines, herbs, non-prescription drugs, or dietary supplements you use. Also tell them if you smoke, drink alcohol, or use illegal drugs. Some items may interact with your medicine. What should I watch for while using this medication? Visit your care team for regular checks on your progress. You may need blood work while you are taking this medication. It may be some time before you see the benefit from this medication. This medication may cause serious skin reactions. They can happen weeks to months after starting the medication. Contact your care team right away if you notice fevers or flu-like symptoms with a rash. The rash may be red or purple and then turn into blisters or peeling of the skin. Or, you might notice a red rash with swelling of the face, lips or lymph nodes in your neck or under your arms. This medication can make you more sensitive to the sun. Keep out of the sun, If you cannot avoid being in the sun, wear protective clothing and sunscreen. Do not use sun lamps or tanning beds/booths. What side effects may I notice from receiving this medication? Side effects that you should report to your care team as soon as possible: Allergic reactions--skin rash, itching, hives, swelling of the face, lips, tongue, or throat Change in sense of smell Change in taste Infection--fever, chills, cough, or sore throat Liver injury--right upper belly pain, loss of appetite, nausea,   light-colored stool, dark yellow or brown urine, yellowing skin or eyes, unusual weakness or fatigue Low red blood cell level--unusual weakness or fatigue, dizziness, headache, trouble breathing Lupus-like syndrome--joint pain, swelling, or stiffness, butterfly-shaped rash on the face, rashes that get worse  in the sun, fever, unusual weakness or fatigue Rash, fever, and swollen lymph nodes Redness, blistering, peeling, or loosening of the skin, including inside the mouth Unusual bruising or bleeding Worsening mood, feelings of depression Side effects that usually do not require medical attention (report to your care team if they continue or are bothersome): Diarrhea Gas Headache Nausea Stomach pain Upset stomach This list may not describe all possible side effects. Call your doctor for medical advice about side effects. You may report side effects to FDA at 1-800-FDA-1088. Where should I keep my medication? Keep out of the reach of children and pets. Store between 20 and 25 degrees C (68 and 77 degrees F). Protect from light. Get rid of any unused medication after the expiration date. To get rid of medications that are no longer needed or have expired: Take the medication to a medication take-back program. Check with your pharmacy or law enforcement to find a location. If you cannot return the medication, check the label or package insert to see if the medication should be thrown out in the garbage or flushed down the toilet. If you are not sure, ask your care team. If it is safe to put it in the trash, take the medication out of the container. Mix the medication with cat litter, dirt, coffee grounds, or other unwanted substance. Seal the mixture in a bag or container. Put it in the trash. NOTE: This sheet is a summary. It may not cover all possible information. If you have questions about this medicine, talk to your doctor, pharmacist, or health care provider.  2023 Elsevier/Gold Standard (2021-04-01 00:00:00)  

## 2022-08-22 NOTE — Progress Notes (Unsigned)
Subjective:   Patient ID: Monique [name], female   DOB: 66 y.o.   MRN: [id number]   HPI Chief Complaint  Patient presents with   Foot Injury    Patient dropped something on the hallux right foot a week ago, patient is having pain rate of pain 2 out of 10, Stabbing throbbing pain, some swelling and bruise under the nail, X-Rays done today    Dropped a hay feeder. It swelled after and the brusing was quite extensive but it is starting to fade. She couldn't put the foot up or down or a sock on the toenail. No drainge. On mobic anyway, took tylenol.    ROS      Objective:  Physical Exam  ***     Assessment:  ***     Plan:  ***

## 2022-08-27 ENCOUNTER — Ambulatory Visit: Payer: PRIVATE HEALTH INSURANCE | Admitting: Podiatry

## 2022-09-05 ENCOUNTER — Other Ambulatory Visit: Payer: Self-pay | Admitting: Podiatry

## 2022-09-05 DIAGNOSIS — Z79899 Other long term (current) drug therapy: Secondary | ICD-10-CM

## 2022-09-05 DIAGNOSIS — B351 Tinea unguium: Secondary | ICD-10-CM

## 2022-09-05 DIAGNOSIS — M79671 Pain in right foot: Secondary | ICD-10-CM

## 2022-09-05 DIAGNOSIS — S90211A Contusion of right great toe with damage to nail, initial encounter: Secondary | ICD-10-CM

## 2022-09-09 ENCOUNTER — Other Ambulatory Visit: Payer: Self-pay | Admitting: Podiatry

## 2022-09-09 ENCOUNTER — Encounter: Payer: Self-pay | Admitting: Podiatry

## 2022-09-09 MED ORDER — TAVABOROLE 5 % EX SOLN: 1.0000 [drp] | Freq: Every day | CUTANEOUS | 2 refills | Status: DC

## 2022-09-10 ENCOUNTER — Ambulatory Visit
Admission: RE | Admit: 2022-09-10 | Discharge: 2022-09-10 | Disposition: A | Discharge status: Home / Self Care | Payer: Medicare Other | Source: Ambulatory Visit | Attending: Family Medicine | Admitting: Family Medicine

## 2022-09-10 ENCOUNTER — Ambulatory Visit
Admission: RE | Admit: 2022-09-10 | Discharge: 2022-09-10 | Disposition: A | Discharge status: Home / Self Care | Payer: Medicare Other | Source: Ambulatory Visit | Attending: Nurse Practitioner | Admitting: Nurse Practitioner

## 2022-09-10 DIAGNOSIS — Z1231 Encounter for screening mammogram for malignant neoplasm of breast: Secondary | ICD-10-CM

## 2022-09-10 DIAGNOSIS — Z780 Asymptomatic menopausal state: Secondary | ICD-10-CM

## 2022-09-26 ENCOUNTER — Other Ambulatory Visit: Payer: Self-pay | Admitting: Internal Medicine

## 2022-10-02 ENCOUNTER — Ambulatory Visit: Payer: PRIVATE HEALTH INSURANCE | Admitting: Podiatry

## 2023-03-05 ENCOUNTER — Other Ambulatory Visit: Payer: Self-pay | Admitting: Podiatry

## 2023-03-05 ENCOUNTER — Ambulatory Visit (INDEPENDENT_AMBULATORY_CARE_PROVIDER_SITE_OTHER): Payer: Medicare Other | Admitting: Podiatry

## 2023-03-05 ENCOUNTER — Encounter: Payer: Self-pay | Admitting: Podiatry

## 2023-03-05 DIAGNOSIS — L603 Nail dystrophy: Secondary | ICD-10-CM | POA: Diagnosis not present

## 2023-03-05 DIAGNOSIS — B351 Tinea unguium: Secondary | ICD-10-CM

## 2023-03-05 MED ORDER — CICLOPIROX 8 % EX SOLN: Freq: Every day | CUTANEOUS | 2 refills | Status: DC

## 2023-03-05 NOTE — Patient Instructions (Addendum)
Start a biotin supplement or a vitamin for hair, skin and nails  Ciclopirox Topical Solution What is this medication? CICLOPIROX (sye kloe PEER ox) treats fungal infections of the nails. It belongs to a group of medications called antifungals. It will not treat infections caused by bacteria or viruses. This medicine may be used for other purposes; ask your health care provider or pharmacist if you have questions. COMMON BRAND NAME(S): Ciclodan Nail Solution, CNL8, Myochrysine, Penlac What should I tell my care team before I take this medication? They need to know if you have any of these conditions: Diabetes (high blood sugar) Immune system problems Organ transplant Receiving steroid inhalers, cream, or lotion Seizures Tingling of the fingers or toes or other nerve disorder An unusual or allergic reaction to ciclopirox, other medications, foods, dyes, or preservatives Pregnant or trying to get pregnant Breast-feeding How should I use this medication? This medication is for external use only. Do not take by mouth. Wash your hands before and after use. If you are treating your hands, only wash your hands before use. Do not get it in your eyes. If you do, rinse your eyes with plenty of cool tap water. Use it as directed on the prescription label at the same time every day. Do not use it more often than directed. Use the medication for the full course as directed by your care team, even if you think you are better. Do not stop using it unless your care team tells you to stop it early. Apply a thin film of the medication to the affected area. Talk to your care team about the use of this medication in children. While it may be prescribed for children as young as 12 years for selected conditions, precautions do apply. Overdosage: If you think you have taken too much of this medicine contact a poison control center or emergency room at once. NOTE: This medicine is only for you. Do not share this  medicine with others. What if I miss a dose? If you miss a dose, use it as soon as you can. If it is almost time for your next dose, use only that dose. Do not use double or extra doses. What may interact with this medication? Interactions are not expected. Do not use any other skin products without telling your care team. This list may not describe all possible interactions. Give your health care provider a list of all the medicines, herbs, non-prescription drugs, or dietary supplements you use. Also tell them if you smoke, drink alcohol, or use illegal drugs. Some items may interact with your medicine. What should I watch for while using this medication? Visit your care team for regular checks on your progress. It may be some time before you see the benefit from this medication. Do not use nail polish or other nail cosmetic products on the treated nails. Removal of the unattached, infected nail by your care team is needed with use of this medication. If you have diabetes or numbness in your fingers or toes, talk to your care team about proper nail care. What side effects may I notice from receiving this medication? Side effects that you should report to your care team as soon as possible: Allergic reactions--skin rash, itching, hives, swelling of the face, lips, tongue, or throat Burning, itching, crusting, or peeling of treated skin Side effects that usually do not require medical attention (report to your care team if they continue or are bothersome): Change in nail shape, thickness, or color  Mild skin irritation, redness, or dryness This list may not describe all possible side effects. Call your doctor for medical advice about side effects. You may report side effects to FDA at 1-800-FDA-1088. Where should I keep my medication? Keep out of the reach of children and pets. Store at room temperature between 20 and 25 degrees C (68 and 77 degrees F). This medication is flammable. Avoid exposure  to heat, fire, flame, and smoking. Get rid of medications that are no longer needed or have expired: Take the medication to a medication take-back program. Check with your pharmacy or law enforcement to find a location. If you cannot return the medication, check the label or package insert to see if the medication should be thrown out in the garbage or flushed down the toilet. If you are not sure, ask your care team. If it is safe to put in the trash, take the medication out of the container. Mix the medication with cat litter, dirt, coffee grounds, or other unwanted substance. Seal the mixture in a bag or container. Put it in the trash. NOTE: This sheet is a summary. It may not cover all possible information. If you have questions about this medicine, talk to your doctor, pharmacist, or health care provider.  2024 Elsevier/Gold Standard (2022-01-05 00:00:00)

## 2023-03-09 NOTE — Progress Notes (Signed)
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  States the nail has grown out some but not much.  No swelling redness or any drainage.   Objective: AAO x3, NAD DP/PT pulses palpable bilaterally, CRT less than 3 seconds On the hallux toenail right side worse than left nails hypertrophic, dystrophic with subungual debris present.  There is some clearing of the proximal nail fold with dried blood.  I reviewed the significance of subungual hematoma as well as onychomycosis. No pain with calf compression, swelling, warmth, erythema  Assessment: 67 year old female with subungual hematoma with mild improvement; onychodystrophy  Plan: -All treatment options discussed with the patient including all alternatives, risks, complications.  -We discussed treatment options including nail removal versus debridement.  I sharply debrided the nails today with any complications or bleeding rest of the hallux toenail for culture to [facility].  -Patient encouraged to call the office with any questions, concerns, change in symptoms.   Vivi Barrack DPM

## 2023-03-10 ENCOUNTER — Ambulatory Visit: Payer: Medicare Other | Admitting: Gastroenterology

## 2023-03-11 ENCOUNTER — Encounter: Payer: Self-pay | Admitting: Podiatry

## 2023-03-22 ENCOUNTER — Ambulatory Visit (INDEPENDENT_AMBULATORY_CARE_PROVIDER_SITE_OTHER): Payer: Medicare Other | Admitting: Gastroenterology

## 2023-03-22 ENCOUNTER — Encounter: Payer: Self-pay | Admitting: Gastroenterology

## 2023-03-22 VITALS — BP 120/63 | HR 62 | Temp 98.3°F | Ht 64.0 in | Wt 221.4 lb

## 2023-03-22 DIAGNOSIS — Z1211 Encounter for screening for malignant neoplasm of colon: Secondary | ICD-10-CM

## 2023-03-22 HISTORY — DX: Encounter for screening for malignant neoplasm of colon: Z12.11

## 2023-03-22 NOTE — Patient Instructions (Signed)
It was a pleasure to meet you today!  We will get you set up for Cologuard testing.  You should receive the kit in the mail sent to your home.  When we receive results we will be in touch.   If you have any recurrent issues swallowing, please let us know.

## 2023-03-22 NOTE — Progress Notes (Signed)
GI Office Note    Referring Provider: Julien Girt, PA* Primary Care Physician:  Julien Girt, PA-C  Primary Gastroenterologist: Hennie Duos. Marletta Lor, DO   Chief Complaint   Chief Complaint  Patient presents with   Colonoscopy    Wants to do cologuard, does not think she needs EGD     History of Present Illness   Monique [name] is a 67 y.o. female presenting today at the request of Patrecia Pour, PA-C for further evaluation of "esophagitis", colon cancer screening.  Patient states when she saw her new PCP she had mentioned a couple episodes of feeling like food got stuck after swallowing.  She has had no further episodes in the past 4 to 5 months.  She believes symptoms were related to stress, occurring a couple of weeks after her grandson was killed.  She denies any heartburn.  No abdominal pain.  Bowel function normal.  No blood in the stool or melena.  She has never had a colonoscopy, typically has completed Cologuard in the past.  Last one over 3 years ago and was negative.  No family history of colon cancer, no bowel concerns therefore she is at average risk for colon cancer and Cologuard would be appropriate.  She would like to continue with Cologuard for screening purposes due to living alone, no family locally, having transportation issues.  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      Labs 01/2023: White blood cell count 6900, hemoglobin 14.4, platelets 295,000, glucose 99, BUN 20, creatinine 1.86, albumin 4.6, total bilirubin 0.4, alk phos 92, AST 23, ALT 26, albumin 4.6.  TSH 3.020.    Medications   Current Outpatient Medications  Medication Sig Dispense Refill   ALPRAZolam (XANAX) 0.5 MG tablet Take 0.5 mg by mouth at bedtime.      amLODipine (NORVASC) 2.5 MG tablet TAKE 1 TABLET BY MOUTH EVERY DAY 90 tablet 3   aspirin EC 81 MG tablet Take 1 tablet (81 mg total) by mouth daily. 90 tablet 3    buPROPion (WELLBUTRIN XL) 150 MG 24 hr tablet Take 150 mg by mouth every morning.     fluticasone (FLONASE) 50 MCG/ACT nasal spray Place 1 spray into both nostrils as needed for allergies.      levothyroxine (SYNTHROID) 150 MCG tablet Take 150 mcg by mouth daily before breakfast.     meloxicam (MOBIC) 15 MG tablet Take 15 mg by mouth daily.     Multiple Vitamin (MULTIVITAMIN) tablet Take 1 tablet by mouth daily.     rosuvastatin (CRESTOR) 20 MG tablet TAKE 1 TABLET BY MOUTH EVERY DAY 90 tablet 3   valsartan-hydrochlorothiazide (DIOVAN-HCT) 320-12.5 MG tablet TAKE 1 TABLET BY MOUTH EVERY DAY     No current facility-administered medications for this visit.    Allergies   Allergies as of 03/22/2023 - Review Complete 03/22/2023  Allergen Reaction Noted   Augmentin [amoxicillin-pot clavulanate] Nausea And Vomiting 03/02/2013   Ace inhibitors Cough 06/14/2015   Terbinafine Diarrhea 08/28/2022    Past Medical History   Past Medical History:  Diagnosis Date   Colon cancer screening 03/22/2023   Essential hypertension 02/13/2016   Hyperlipidemia    Hypothyroidism    Unspecified hypothyroidism 03/02/2013    Past Surgical History   Past Surgical History:  Procedure Laterality Date   ENDOMETRIAL ABLATION     ROTATOR CUFF REPAIR  20 years ago   WISDOM TOOTH EXTRACTION  09/22/1971    Past  Family History   Family History  Problem Relation Age of Onset   Heart Problems Mother    Heart attack Father        multiple   Breast cancer Neg Hx    Colon cancer Neg Hx     Past Social History   Social History   Socioeconomic History   Marital status: Divorced    Spouse name: Not on file   Number of children: Not on file   Years of education: Not on file   Highest education level: Not on file  Occupational History   Not on file  Tobacco Use   Smoking status: Never   Smokeless tobacco: Never  Substance and Sexual Activity   Alcohol use: Yes    Alcohol/week: 0.0 - 1.0 standard  drinks of alcohol   Drug use: No   Sexual activity: Never    Partners: Male    Birth control/protection: Post-menopausal  Other Topics Concern   Not on file  Social History Narrative   Not on file   Social Determinants of Health   Financial Resource Strain: Not on file  Food Insecurity: Not on file  Transportation Needs: Not on file  Physical Activity: Not on file  Stress: Not on file  Social Connections: Not on file  Intimate Partner Violence: Not on file    Review of Systems   General: Negative for anorexia, weight loss, fever, chills, fatigue, weakness. Eyes: Negative for vision changes.  ENT: Negative for hoarseness, difficulty swallowing , nasal congestion. CV: Negative for chest pain, angina, palpitations, dyspnea on exertion, peripheral edema.  Respiratory: Negative for dyspnea at rest, dyspnea on exertion, cough, sputum, wheezing.  GI: See history of present illness. GU:  Negative for dysuria, hematuria, urinary incontinence, urinary frequency, nocturnal urination.  MS: Negative for joint pain, low back pain.  Derm: Negative for rash or itching.  Neuro: Negative for weakness, abnormal sensation, seizure, frequent headaches, memory loss,  confusion.  Psych: Negative for anxiety, depression, suicidal ideation, hallucinations.  Endo: Negative for unusual weight change.  Heme: Negative for bruising or bleeding. Allergy: Negative for rash or hives.  Physical Exam   BP 120/63 (BP Location: Right Arm, Patient Position: Sitting, Cuff Size: Large)   Pulse 62   Temp 98.3 F (36.8 C) (Oral)   Ht 5\' 4"  (1.626 m)   Wt 221 lb 6.4 oz (100.4 kg)   LMP 08/02/2012   SpO2 96%   BMI 38.00 kg/m    General: Well-nourished, well-developed in no acute distress.  Head: Normocephalic, atraumatic.   Eyes: Conjunctiva pink, no icterus. Mouth: Oropharyngeal mucosa moist and pink  Neck: Supple without thyromegaly, masses, or lymphadenopathy.  Lungs: Clear to auscultation  bilaterally.  Heart: Regular rate and rhythm, no murmurs rubs or gallops.  Abdomen: Bowel sounds are normal, nontender, nondistended, no hepatosplenomegaly or masses,  no abdominal bruits or hernia, no rebound or guarding.   Rectal: not performed Extremities: No lower extremity edema. No clubbing or deformities.  Neuro: Alert and oriented x 4 , grossly normal neurologically.  Skin: Warm and dry, no rash or jaundice.   Psych: Alert and cooperative, normal mood and affect.  Labs   See hpi  Imaging Studies   No results found.  Assessment   Colon cancer screening: patient wants to purse Cologuard testing as she has done in the past. She is average risk for colon cancer, no bowel concerns or FH of colon cancer. She previously managed GI practice and is aware that colonoscopy  is the gold standard for colon cancer screening/prevention but due to transportation concerns she prefers Cologuard initially.  Dysphagia: couple of episodes several months back possibly stress related as outlined above. Patient doing well and wants to hold off on EGD.   PLAN   Cologuard.  Monitor for any recurrent swallowing issues.    Leanna Battles. Melvyn Neth, MHS, PA-C [facility]

## 2023-03-23 ENCOUNTER — Encounter: Payer: Self-pay | Admitting: Gastroenterology

## 2023-04-05 ENCOUNTER — Encounter (HOSPITAL_COMMUNITY): Payer: Self-pay | Admitting: Psychiatry

## 2023-04-05 ENCOUNTER — Ambulatory Visit (INDEPENDENT_AMBULATORY_CARE_PROVIDER_SITE_OTHER): Payer: Medicare Other | Admitting: Psychiatry

## 2023-04-05 DIAGNOSIS — F321 Major depressive disorder, single episode, moderate: Secondary | ICD-10-CM

## 2023-04-05 DIAGNOSIS — F411 Generalized anxiety disorder: Secondary | ICD-10-CM | POA: Diagnosis not present

## 2023-04-05 DIAGNOSIS — Z79899 Other long term (current) drug therapy: Secondary | ICD-10-CM | POA: Insufficient documentation

## 2023-04-05 DIAGNOSIS — F4310 Post-traumatic stress disorder, unspecified: Secondary | ICD-10-CM

## 2023-04-05 DIAGNOSIS — F5101 Primary insomnia: Secondary | ICD-10-CM | POA: Diagnosis not present

## 2023-04-05 DIAGNOSIS — F324 Major depressive disorder, single episode, in partial remission: Secondary | ICD-10-CM | POA: Insufficient documentation

## 2023-04-05 HISTORY — DX: Other long term (current) drug therapy: Z79.899

## 2023-04-05 IMAGING — MG MM DIGITAL SCREENING BILAT W/ TOMO AND CAD
8 series · 8 of 24 positions shown · non-contrast
Comparison: Previous exam(s).

CLINICAL DATA: Screening.

EXAM:
DIGITAL SCREENING BILATERAL MAMMOGRAM WITH TOMOSYNTHESIS AND CAD
TECHNIQUE: Bilateral screening digital craniocaudal and mediolateral oblique
mammograms were obtained. Bilateral screening digital breast
tomosynthesis was performed. The images were evaluated with
computer-aided detection.

[R MLO synth-2D]
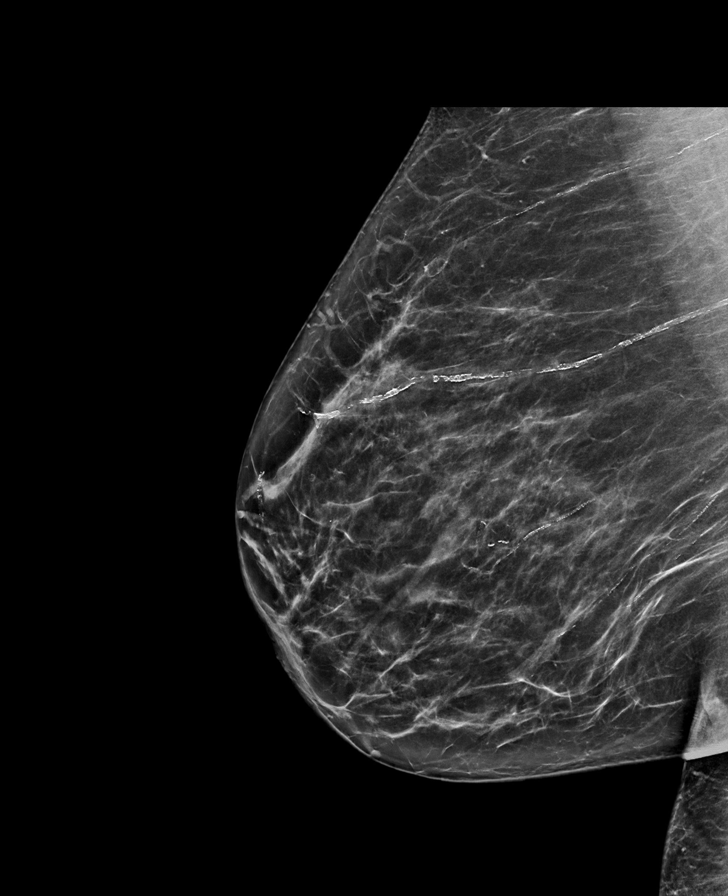

[L MLO synth-2D]
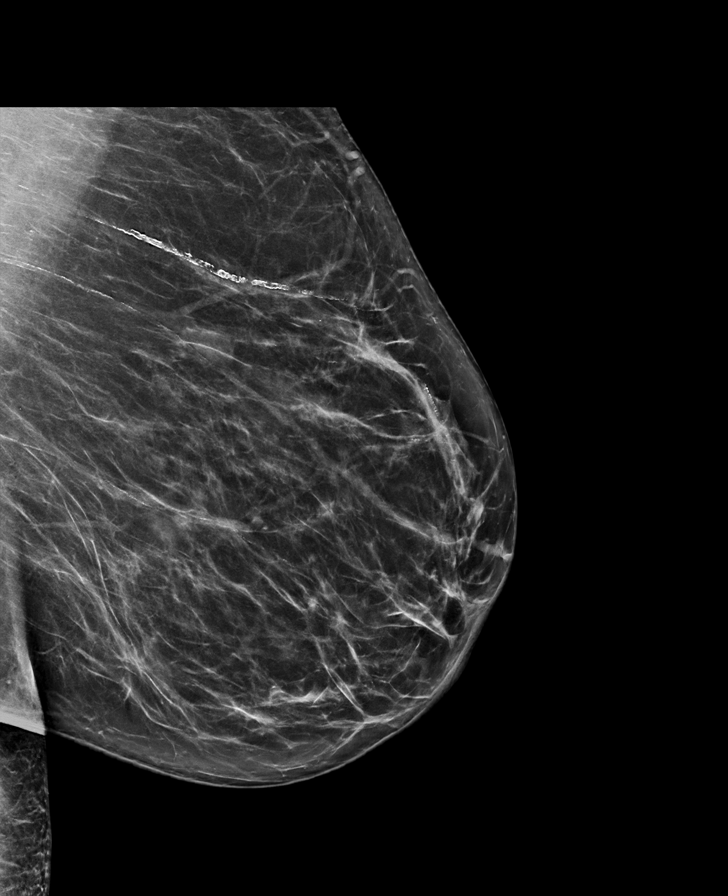

[L CC synth-2D]
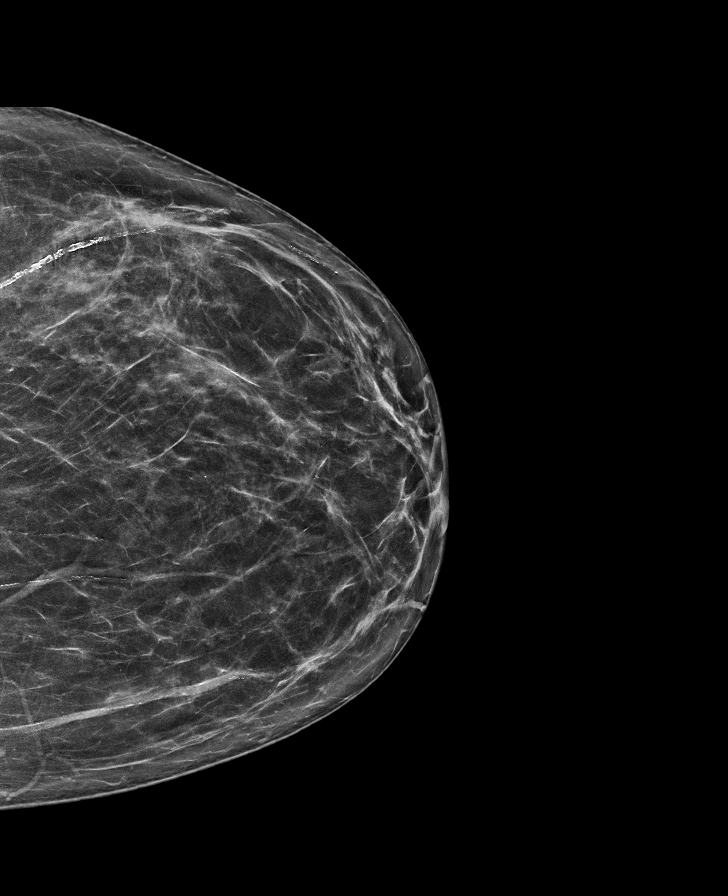

[R CC synth-2D]
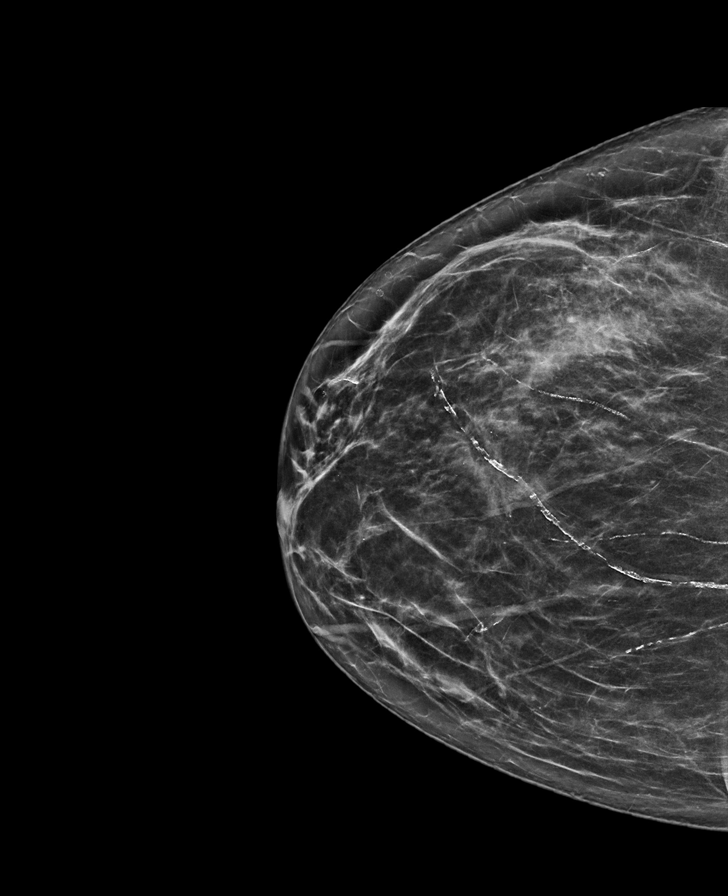

[R MLO tomo · tomo slice 39/76.0]
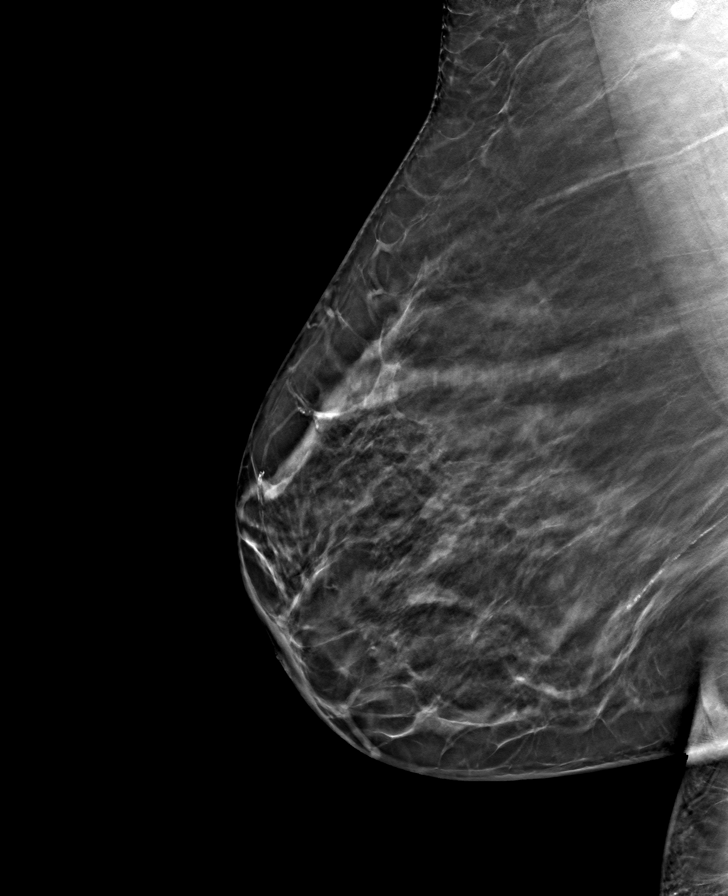

[L CC tomo · tomo slice 37/73.0]
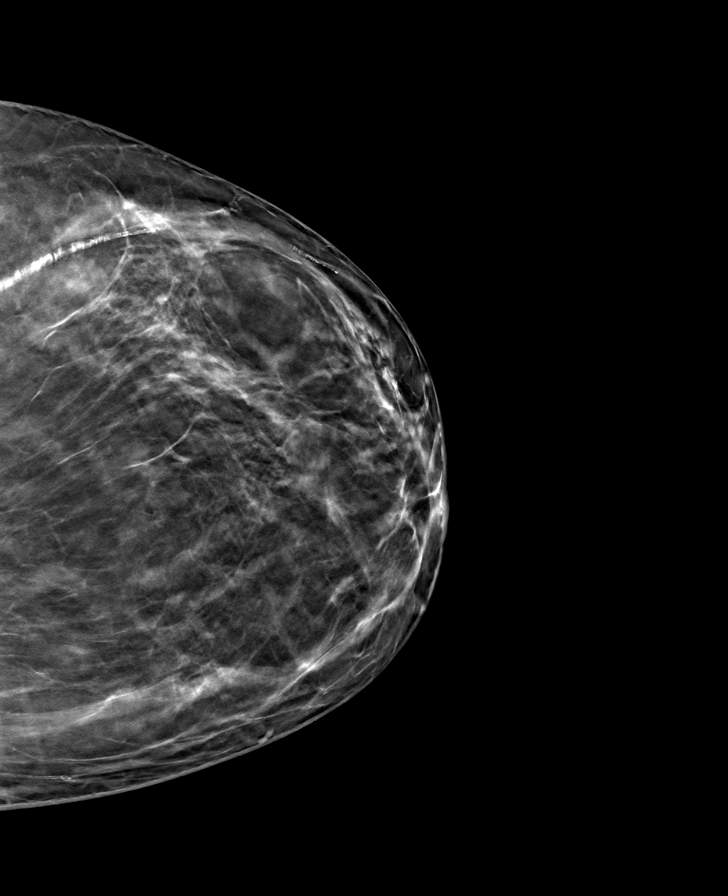

[L MLO tomo · tomo slice 39/78.0]
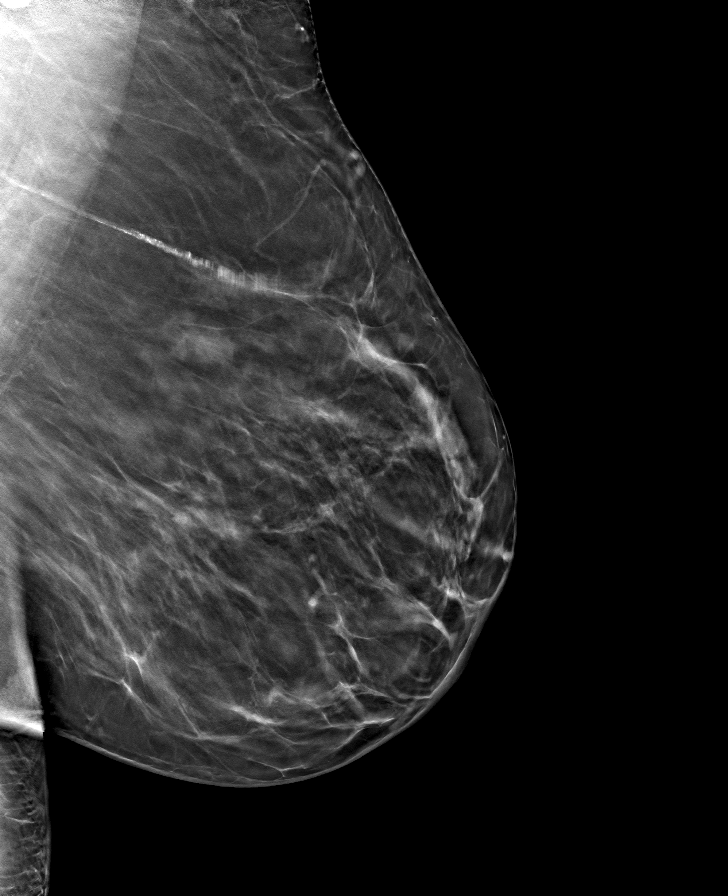

[R CC tomo · tomo slice 35/69.0]
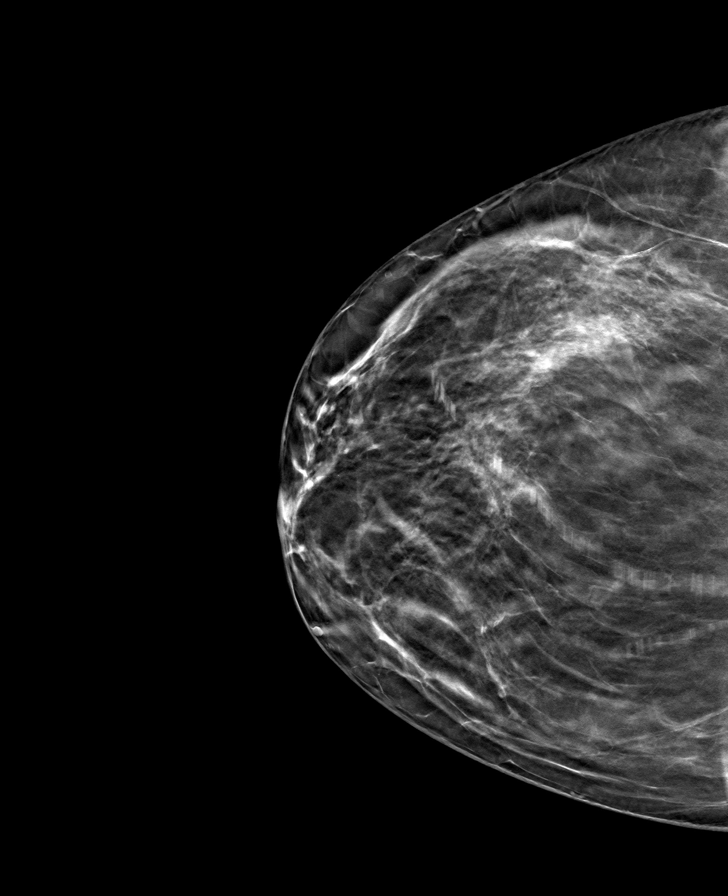

[8 of 24 positions shown; findings below may reference images not displayed]

ACR Breast Density Category b: There are scattered areas of
fibroglandular density.
FINDINGS: There are no findings suspicious for malignancy.
IMPRESSION: No mammographic evidence of malignancy. A result letter of this
screening mammogram will be mailed directly to the patient.

RECOMMENDATION:
Screening mammogram in one year. (Code:51-O-LD2)

BI-RADS CATEGORY  1: Negative.

## 2023-04-05 MED ORDER — TRAZODONE HCL 50 MG PO TABS
ORAL_TABLET | ORAL | 2 refills | Status: DC
Start: 2023-04-05 — End: 2023-04-28
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
## 2023-04-05 NOTE — Patient Instructions (Signed)
We decrease Xanax to 0.25 mg nightly for the next month with plan to fully discontinue after 1 month.  We will begin to replace this with trazodone 25 to 50 mg nightly insomnia.  We also switched Wellbutrin to be taken in the morning to assist with sleep.  I will try to coordinate with your PCP to get a vitamin D, B12, folate, iron panel and consider nutrition referral.

## 2023-04-14 LAB — COLOGUARD: COLOGUARD: NEGATIVE

## 2023-04-28 ENCOUNTER — Other Ambulatory Visit (HOSPITAL_COMMUNITY): Payer: Self-pay | Admitting: Psychiatry

## 2023-04-28 DIAGNOSIS — F5101 Primary insomnia: Secondary | ICD-10-CM

## 2023-05-10 ENCOUNTER — Encounter (HOSPITAL_COMMUNITY): Payer: Self-pay | Admitting: Psychiatry

## 2023-05-10 ENCOUNTER — Telehealth (INDEPENDENT_AMBULATORY_CARE_PROVIDER_SITE_OTHER): Payer: Medicare Other | Admitting: Psychiatry

## 2023-05-10 DIAGNOSIS — F411 Generalized anxiety disorder: Secondary | ICD-10-CM | POA: Diagnosis not present

## 2023-05-10 DIAGNOSIS — E039 Hypothyroidism, unspecified: Secondary | ICD-10-CM

## 2023-05-10 DIAGNOSIS — F321 Major depressive disorder, single episode, moderate: Secondary | ICD-10-CM

## 2023-05-10 DIAGNOSIS — F4310 Post-traumatic stress disorder, unspecified: Secondary | ICD-10-CM | POA: Diagnosis not present

## 2023-05-10 DIAGNOSIS — F5101 Primary insomnia: Secondary | ICD-10-CM | POA: Diagnosis not present

## 2023-05-10 MED ORDER — TRAZODONE HCL 100 MG PO TABS
ORAL_TABLET | ORAL | 2 refills | Status: DC
Start: 2023-05-10 — End: 2023-06-04

## 2023-05-10 MED ORDER — BUPROPION HCL ER (XL) 150 MG PO TB24: 150.0000 mg | ORAL_TABLET | Freq: Every morning | ORAL | 2 refills | Status: DC

## 2023-05-10 NOTE — Progress Notes (Signed)
BH MD Outpatient Progress Note  05/10/2023 12:08 PM Monique [name]  MRN:  [id number]  Assessment:  [name] presents for follow-up evaluation. Today, 05/10/23, patient reports able to successfully come off Xanax with cross titration of trazodone.  Sleep has been slightly worse in the last week since fully discontinuing but does note the trazodone is helping and would be amenable to titration as outlined in plan below.  Outside of this sleep also benefiting from switching Wellbutrin to the morning and she is finding that her depression and anxiety have gotten a bit better with this change.  She is overall satisfied with medication regimen so we will keep Wellbutrin the same for now.  She will still coordinate with PCP to get blood work when she sees him next.  Still not interested in psychotherapy at this time.  Follow-up in 1 month.  For safety, her acute risk factors for suicide are: living alone, current diagnosis of depression and PTSD. Her chronic risk factors for suicide are: access to firearms, older age, living alone, chronic prescription benzodiazepine use. Her protective factors are: beloved pets in the home, supportive family, employment, actively seeking and engaging with mental health care, firearms are secured when not in use, no suicidal ideation in session. While future events cannot be fully predicted she does not currently meet IVC criteria and can be continued as an outpatient.  Identifying Information: Monique [name] is a 67 y.o. female with a history of primary insomnia, history of long-term use of prescription benzodiazepine, generalized anxiety disorder, PTSD with single episode of major depressive disorder after her grandson's death in 02/09/2024hypothyroidism who is an established patient with [facility] participating in follow-up via video conferencing. Initial evaluation of depression, insomnia, anxiety on 04/05/23; please see that  note for full case formulation.  Patient reported decades of primary insomnia with previous trials of Ambien and Xanax collateral of which was present for 10 to 12 years.  Insomnia at time of initial appointment was multifactorial with taking Wellbutrin that had been started in May 2024 at nighttime along with the chronic Xanax use.  This was in addition to the single episode of major depressive disorder which was the result of the sudden unexpected death of her grandson from a motor vehicle collision in 2022-10-30.  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  Outside of this and prior to the traumatic event she had generalized anxiety disorder for many years.  Panic attacks occurred every 3 to 4 years.  She was amenable to taper of Xanax we will plan to discontinue after 1 month decreased dose.  Wellbutrin was switched to the morning as well and added trazodone to replace Xanax for sleep aid.  She tried 4 sessions of psychotherapy previously and did not want to pursue at time of initial appointment.  She reported a family history of Parkinson's disease in her father and paternal uncle so we will need to keep this in mind prescribing.  Concentration impairment could be from depression and chronic poor sleep but was also noted to eat roughly 2 meals per day and small meals at that.  Coordinated with PCP for blood work and possible nutrition referral.    Plan:  # PTSD  single episode may depressive disorder, moderate Past medication trials: See medication trials below Status of problem: Improving Interventions: --  Continue Wellbutrin XL 150 mg daily --Coordinate with PCP for vitamin D, B12, folate, iron panel and consider nutrition referral  # Primary insomnia Past medication trials: Ambien, Xanax Status of problem: Improving Interventions: --Titrate trazodone to 50-100mg  nightly (s7/15/24,  i8/19/24)  # Generalized anxiety disorder Past medication trials:  Status of problem: Improving Interventions: -- Wellbutrin as above  # Hypothyroidism Past medication trials:  Status of problem: Chronic and stable Interventions: -- Continue Synthroid per PCP  Patient was given contact information for behavioral health clinic and was instructed to call 911 for emergencies.   Subjective:  Chief Complaint:  Chief Complaint  Patient presents with   Anxiety   Depression   Follow-up   Insomnia    Interval History: Things have been pretty good since last appointment. Catching up on chores and more motivation. Has been off xanax for about 1 week and having a bit more difficulty sleeping in this last week. Good night was 6hrs straight. Has started the trazodone and is now up to 50mg , amenable to have 100mg  available as needed. Switching wellbutrin has been helpful as above. Overall satisfied with progress on current regimen. Hasn't gotten bloodwork yet but will see PCP in October next.   Visit Diagnosis:    ICD-10-CM   1. PTSD (post-traumatic stress disorder)  F43.10     2. Generalized anxiety disorder  F41.1 buPROPion (WELLBUTRIN XL) 150 MG 24 hr tablet    3. Primary insomnia  F51.01 traZODone (DESYREL) 100 MG tablet    4. Major depressive disorder, single episode, moderate (HCC)  F32.1 buPROPion (WELLBUTRIN XL) 150 MG 24 hr tablet      Past Psychiatric History:  Diagnoses: primary insomnia, history of long-term use of prescription benzodiazepine, generalized anxiety disorder, PTSD with single episode of major depressive disorder after her grandson's death in 02/15/24hypothyroidism Medication trials: wellbutrin (effective) started in May 2024; prozac maybe in college, xanax (for insomnia), ambien (effective) Previous psychiatrist/therapist: yes to therapy Hospitalizations: none Suicide attempts: none SIB: none Hx of violence towards others: none Current access to guns:  yes secured in a gunsafe Hx of trauma/abuse: none Substance use: none  Past Medical History:  Past Medical History:  Diagnosis Date   Colon cancer screening 03/22/2023   Essential hypertension 02/13/2016   Hyperlipidemia    Hypothyroidism    Long-term current use of benzodiazepine 04/05/2023   Unspecified hypothyroidism 03/02/2013    Past Surgical History:  Procedure Laterality Date   ENDOMETRIAL ABLATION     ROTATOR CUFF REPAIR  20 years ago   WISDOM TOOTH EXTRACTION  09/22/1971    Family Psychiatric History: two children with ADHD, father and paternal uncle with Parkinson's disease   Family History:  Family History  Problem Relation Age of Onset   Heart Problems Mother    Heart attack Father        multiple   Breast cancer Neg Hx    Colon cancer Neg Hx     Social History:  Academic/Vocational: Chief Financial Officer, mostly retired  Social History   Socioeconomic History   Marital status: Divorced    Spouse name: Not on file   Number of children: Not on file   Years of education: Not on file   Highest education level: Not on file  Occupational History   Not on file  Tobacco Use   Smoking status: Never   Smokeless tobacco: Never  Substance and Sexual Activity   Alcohol use: Yes    Alcohol/week: 0.0 - 1.0 standard  drinks of alcohol    Comment: 1 unit of alcohol every 3 to 4 months   Drug use: Never   Sexual activity: Never    Partners: Male    Birth control/protection: Post-menopausal  Other Topics Concern   Not on file  Social History Narrative   Not on file   Social Determinants of Health   Financial Resource Strain: Low Risk  (03/08/2023)   Received from [facility], [facility]   Overall Financial Resource Strain (CARDIA)    Difficulty of Paying Living Expenses: Not hard at all  Food Insecurity: No Food Insecurity (03/08/2023)   Received from [facility], [facility]   Hunger Vital Sign    Worried About Running Out of Food in the  Last Year: Never true    Ran Out of Food in the Last Year: Never true  Transportation Needs: No Transportation Needs (03/08/2023)   Received [facility]   PRAPARE - Transportation    Lack of Transportation (Medical): No    Lack of Transportation (Non-Medical): No  Physical Activity: Sufficiently Active (03/08/2023)   Received from [facility], [facility]   Exercise Vital Sign    Days of Exercise per Week: 5 days    Minutes of Exercise per Session: 30 min  Recent Concern: Physical Activity - Insufficiently Active (02/02/2023)   Received from [facility]   Exercise Vital Sign    Days of Exercise per Week: 2 days    Minutes of Exercise per Session: 30 min  Stress: Stress Concern Present (03/08/2023)   Received from [facility], [facility] - Occupational Stress Questionnaire    Feeling of Stress : Rather much  Social Connections: Somewhat Isolated (03/08/2023)   Received from [facility], [facility]   Social Network    How would you rate your social network (family, work, friends)?: Restricted participation with some degree of social isolation    Allergies:  Allergies  Allergen Reactions   Augmentin [Amoxicillin-Pot Clavulanate] Nausea And Vomiting   Ace Inhibitors Cough   Terbinafine Diarrhea    Current Medications: Current Outpatient Medications  Medication Sig Dispense Refill   naproxen sodium (ALEVE) 220 MG tablet Take 220 mg by mouth daily as needed (pain).     amLODipine (NORVASC) 2.5 MG tablet TAKE 1 TABLET BY MOUTH EVERY DAY 90 tablet 3   aspirin EC 81 MG tablet Take 1 tablet (81 mg total) by mouth daily. 90 tablet 3   buPROPion (WELLBUTRIN XL) 150 MG 24 hr tablet Take 1 tablet (150 mg total) by mouth every morning. 30 tablet 2   ciclopirox (PENLAC) 8 % solution APPLY TOPICALLY AT BEDTIME. APPLY OVER NAIL AND SURROUNDING SKIN. APPLY DAILY OVER PREVIOUS COAT. AFTER SEVEN (7) DAYS, MAY REMOVE WITH  ALCOHOL AND CONTINUE CYCLE. 6.6 mL 2   fluticasone (FLONASE) 50 MCG/ACT nasal spray Place 1 spray into both nostrils as needed for allergies.      levothyroxine (SYNTHROID) 150 MCG tablet Take 150 mcg by mouth daily before breakfast.     Multiple Vitamin (MULTIVITAMIN) tablet Take 1 tablet by mouth daily.     rosuvastatin (CRESTOR) 20 MG tablet TAKE 1 TABLET BY MOUTH EVERY DAY 90 tablet 3   traZODone (DESYREL) 100 MG tablet TAKE 1/2 OR 1 PILL BY MOUTH NIGHTLY. 30 tablet 2   valsartan-hydrochlorothiazide (DIOVAN-HCT) 320-12.5 MG tablet TAKE 1 TABLET BY MOUTH EVERY DAY     No current facility-administered medications for this visit.  ROS: Review of Systems  Constitutional:  Positive for appetite change. Negative for unexpected weight change.  Endocrine: Positive for cold intolerance and heat intolerance. Negative for polyphagia.  Musculoskeletal:  Positive for arthralgias. Negative for back pain.  Skin:        No hair loss  Psychiatric/Behavioral:  Positive for sleep disturbance. Negative for decreased concentration, dysphoric mood, hallucinations, self-injury and suicidal ideas. The patient is nervous/anxious.     Objective:  Psychiatric Specialty Exam: Last menstrual period 08/02/2012.There is no height or weight on file to calculate BMI.  General Appearance: Casual, Fairly Groomed, and wearing glasses.  Appears stated age  Eye Contact:  Good  Speech:  Clear and Coherent and Normal Rate  Volume:  Normal  Mood:   "Pretty good"  Affect:  Appropriate, Congruent, Constricted, and improving range.  Significantly brighter than previously  Thought Content: Logical and Hallucinations: None   Suicidal Thoughts:  No  Homicidal Thoughts:  No  Thought Process:  Coherent, Goal Directed, and Linear  Orientation:  Full (Time, Place, and Person)    Memory:  Grossly intact   Judgment:  Fair  Insight:  Fair  Concentration:  Concentration: Good  Recall:  not formally assessed   Fund of  Knowledge: Fair  Language: Good  Psychomotor Activity:  Normal  Akathisia:  No  AIMS (if indicated): not done  Assets:  Communication Skills Desire for Improvement Financial Resources/Insurance Housing Leisure Time Physical Health Resilience Social Support Talents/Skills Transportation Vocational/Educational  ADL's:  Intact  Cognition: WNL  Sleep:  Poor but improving   PE: General: sits comfortably in view of camera; no acute distress  Pulm: no increased work of breathing on room air  MSK: all extremity movements appear intact  Neuro: no focal neurological deficits observed  Gait & Station: unable to assess by video    Metabolic Disorder Labs: No results found for: "HGBA1C", "MPG" No results found for: "PROLACTIN" No results found for: "CHOL", "TRIG", "HDL", "CHOLHDL", "VLDL", "LDLCALC" No results found for: "TSH"  Therapeutic Level Labs: No results found for: "LITHIUM" No results found for: "VALPROATE" No results found for: "CBMZ"  Screenings:  PHQ2-9    Flowsheet Row Office Visit from 04/05/2023 in [facility] at [facility] Total Score 4  PHQ-9 Total Score 18      Flowsheet Row Office Visit from 04/05/2023 in [facility] at [place]  C-SSRS RISK CATEGORY No Risk       Collaboration of Care: Collaboration of Care: Medication Management AEB as above and Primary Care Provider AEB as above  Patient/Guardian was advised Release of Information must be obtained prior to any record release in order to collaborate their care with an outside provider. Patient/Guardian was advised if they have not already done so to contact the registration department to sign all necessary forms in order for [place] to release information regarding their care.   Consent: Patient/Guardian gives verbal consent for treatment and assignment of benefits for services provided during this visit. Patient/Guardian expressed  understanding and agreed to proceed.   Televisit via video: I connected with patient on 05/10/23 at 11:30 AM EDT by a video enabled telemedicine application and verified that I am speaking with the correct person using two identifiers.  Location: Patient: Home in [place] Provider: remote office in [place]   I discussed the limitations of evaluation and management by telemedicine and the availability of in person appointments. The patient expressed understanding and agreed to proceed.  I  discussed the assessment and treatment plan with the patient. The patient was provided an opportunity to ask questions and all were answered. The patient agreed with the plan and demonstrated an understanding of the instructions.   The patient was advised to call back or seek an in-person evaluation if the symptoms worsen or if the condition fails to improve as anticipated.  I provided 10 minutes dedicated to the care of this patient via video on the date of this encounter to include chart review, face-to-face time with the patient, medication management/counseling.  Elsie Lincoln, MD 05/10/2023, 12:08 PM

## 2023-05-10 NOTE — Patient Instructions (Addendum)
We changed the formulation of your trazodone to be 100 mg tablet.  You may take half or 1 full tablet by mouth nightly for insomnia.  We otherwise kept the Wellbutrin the same.  When you see your PCP next make sure they order a vitamin D, B12, folate, iron panel and consider nutrition referral.

## 2023-05-25 ENCOUNTER — Ambulatory Visit (INDEPENDENT_AMBULATORY_CARE_PROVIDER_SITE_OTHER): Payer: Medicare Other | Admitting: Orthopedic Surgery

## 2023-05-25 ENCOUNTER — Other Ambulatory Visit (INDEPENDENT_AMBULATORY_CARE_PROVIDER_SITE_OTHER): Payer: Medicare Other

## 2023-05-25 ENCOUNTER — Encounter: Payer: Self-pay | Admitting: Orthopedic Surgery

## 2023-05-25 VITALS — BP 101/67 | HR 61 | Ht 64.0 in | Wt 218.4 lb

## 2023-05-25 DIAGNOSIS — M1711 Unilateral primary osteoarthritis, right knee: Secondary | ICD-10-CM | POA: Diagnosis not present

## 2023-05-25 DIAGNOSIS — G8929 Other chronic pain: Secondary | ICD-10-CM

## 2023-05-25 NOTE — Progress Notes (Signed)
New Patient Visit  Assessment: Monique [name] is a 67 y.o. female with the following: 1. Arthritis of right knee  Plan: [name] has moderate to severe right knee arthritis.  She does have a history of tibial plateau fracture, greater than 40 years ago.  Radiographs demonstrate advanced degenerative changes within all 3 compartments.  We discussed multiple treatment options, including medicines, bracing, PT as well as cortisone or HA injections.  She would like to proceed with physical therapy and a cortisone injection.  This was completed in clinic today.  She will return as needed.  We also briefly discussed total knee arthroplasty.  She states that she is not ready to consider this at this time.    Procedure note injection Right knee joint   Verbal consent was obtained to inject the right knee joint  Timeout was completed to confirm the site of injection.  The skin was prepped with alcohol and ethyl chloride was sprayed at the injection site.  A 21-gauge needle was used to inject 40 mg of Depo-Medrol and 1% lidocaine (4 cc) into the right knee using an anterolateral approach.  There were no complications. A sterile bandage was applied.   Follow-up: Return if symptoms worsen or fail to improve.  Subjective:  Chief Complaint  Patient presents with   Knee Pain    R knee pain for yrs but has become more painful over the past 6 mos. Had her knee drained and injected 6 mos ago but now pain/swelling is back.     History of Present Illness: Monique [name] is a 67 y.o. female who presents for evaluation of right knee pain.  She has had progressively worsening pain in her right knee.  It has been bad for the past 6 months.  She previously had an injection, which was completed greater than 3 months ago.  This improved her symptoms for a week.  She uses a brace occasionally.  She is not taking medicines consistently.  She has previously taken meloxicam, with  improvement in her symptoms.  She has not worked with physical therapy.  Pain is primarily in the lateral aspect of the knee.  [place], and has worsening pain when getting into her tractor, as well as when she is riding her horse.   Review of Systems: No fevers or chills No numbness or tingling No chest pain No shortness of breath No bowel or bladder dysfunction No GI distress No headaches   Medical History:  Past Medical History:  Diagnosis Date   Colon cancer screening 03/22/2023   Essential hypertension 02/13/2016   Hyperlipidemia    Hypothyroidism    Long-term current use of benzodiazepine 04/05/2023   Unspecified hypothyroidism 03/02/2013    Past Surgical History:  Procedure Laterality Date   ENDOMETRIAL ABLATION     ROTATOR CUFF REPAIR  20 years ago   WISDOM TOOTH EXTRACTION  09/22/1971    Family History  Problem Relation Age of Onset   Heart Problems Mother    Heart attack Father        multiple   Breast cancer Neg Hx    Colon cancer Neg Hx    Social History   Tobacco Use   Smoking status: Never   Smokeless tobacco: Never  Substance Use Topics   Alcohol use: Yes    Alcohol/week: 0.0 - 1.0 standard drinks of alcohol    Comment: 1 unit of alcohol every 3 to 4 months  Drug use: Never    Allergies  Allergen Reactions   Augmentin [Amoxicillin-Pot Clavulanate] Nausea And Vomiting   Ace Inhibitors Cough   Terbinafine Diarrhea    Current Meds  Medication Sig   amLODipine (NORVASC) 2.5 MG tablet TAKE 1 TABLET BY MOUTH EVERY DAY   aspirin EC 81 MG tablet Take 1 tablet (81 mg total) by mouth daily.   buPROPion (WELLBUTRIN XL) 150 MG 24 hr tablet Take 1 tablet (150 mg total) by mouth every morning.   ciclopirox (PENLAC) 8 % solution APPLY TOPICALLY AT BEDTIME. APPLY OVER NAIL AND SURROUNDING SKIN. APPLY DAILY OVER PREVIOUS COAT. AFTER SEVEN (7) DAYS, MAY REMOVE WITH ALCOHOL AND CONTINUE CYCLE.   fluticasone (FLONASE) 50 MCG/ACT  nasal spray Place 1 spray into both nostrils as needed for allergies.    levothyroxine (SYNTHROID) 150 MCG tablet Take 150 mcg by mouth daily before breakfast.   Multiple Vitamin (MULTIVITAMIN) tablet Take 1 tablet by mouth daily.   naproxen sodium (ALEVE) 220 MG tablet Take 220 mg by mouth daily as needed (pain).   rosuvastatin (CRESTOR) 20 MG tablet TAKE 1 TABLET BY MOUTH EVERY DAY   traZODone (DESYREL) 100 MG tablet TAKE 1/2 OR 1 PILL BY MOUTH NIGHTLY.   valsartan-hydrochlorothiazide (DIOVAN-HCT) 320-12.5 MG tablet TAKE 1 TABLET BY MOUTH EVERY DAY    Objective: BP 101/67   Pulse 61   Ht 5\' 4"  (1.626 m)   Wt 218 lb 6.4 oz (99.1 kg)   LMP 08/02/2012   BMI 37.49 kg/m   Physical Exam:  General: Alert and oriented. and No acute distress. Gait: Right sided antalgic gait.  Evaluation of the right knee is without swelling.  No obvious deformity.  No increased laxity varus or valgus stress.  Negative Lachman.  She is able to achieve full extension.  She tolerates flexion beyond 100 degrees.  Tenderness to palpation along the medial and lateral joint lines.  Positive crepitus with range of motion testing.  IMAGING: I personally ordered and reviewed the following images   X-rays of the right knee were obtained in clinic today.  No acute injuries are noted.  Mild valgus alignment overall.  There are advanced degenerative changes within all 3 compartments.  There are associated osteophytes.  There appears to be some subchondral cysts within the medial aspect of the knee.  No bony lesions.  Impression: Right knee x-rays with moderate to severe degenerative changes.   New Medications:  No orders of the defined types were placed in this encounter.     Oliver Barre, MD  05/25/2023 10:21 AM

## 2023-05-25 NOTE — Patient Instructions (Signed)

## 2023-05-26 ENCOUNTER — Telehealth (HOSPITAL_COMMUNITY): Payer: Self-pay

## 2023-05-26 DIAGNOSIS — F411 Generalized anxiety disorder: Secondary | ICD-10-CM

## 2023-05-26 DIAGNOSIS — F321 Major depressive disorder, single episode, moderate: Secondary | ICD-10-CM

## 2023-05-26 MED ORDER — BUPROPION HCL ER (XL) 150 MG PO TB24
150.0000 mg | ORAL_TABLET | Freq: Every morning | ORAL | 0 refills | Status: DC
Start: 2023-05-26 — End: 2023-08-17

## 2023-05-26 NOTE — Telephone Encounter (Addendum)
[facility] [address] in [place] faxed over a 90 day rx request for pt's buPROPion (WELLBUTRIN XL) 150 MG 24 hr tablet. Pt scheduled 06/21/23. Please advise.   Refill sent as requested.

## 2023-06-02 ENCOUNTER — Other Ambulatory Visit (HOSPITAL_COMMUNITY): Payer: Self-pay | Admitting: Psychiatry

## 2023-06-02 DIAGNOSIS — F5101 Primary insomnia: Secondary | ICD-10-CM

## 2023-06-09 ENCOUNTER — Other Ambulatory Visit: Payer: Self-pay

## 2023-06-09 ENCOUNTER — Ambulatory Visit (HOSPITAL_COMMUNITY): Payer: Medicare Other | Attending: Orthopedic Surgery

## 2023-06-09 ENCOUNTER — Encounter (HOSPITAL_COMMUNITY): Payer: Self-pay

## 2023-06-09 DIAGNOSIS — M25561 Pain in right knee: Secondary | ICD-10-CM | POA: Insufficient documentation

## 2023-06-09 DIAGNOSIS — G8929 Other chronic pain: Secondary | ICD-10-CM

## 2023-06-09 DIAGNOSIS — M1711 Unilateral primary osteoarthritis, right knee: Secondary | ICD-10-CM | POA: Insufficient documentation

## 2023-06-09 NOTE — Therapy (Signed)
OUTPATIENT PHYSICAL THERAPY LOWER EXTREMITY EVALUATION   Patient Name: Monique [name] MRN: [id number] DOB:Nov 26, 1955, 67 y.o., female Today's Date: 06/09/2023  END OF SESSION:  PT End of Session - 06/09/23 1349     Visit Number 1    Number of Visits 8    Date for PT Re-Evaluation 07/07/23    Authorization Type Medicare (A&B) and AARP    PT Start Time 1300    PT Stop Time 1345    PT Time Calculation (min) 45 min    Activity Tolerance Patient tolerated treatment well    Behavior During Therapy [facility] for tasks assessed/performed             Past Medical History:  Diagnosis Date   Colon cancer screening 03/22/2023   Essential hypertension 02/13/2016   Hyperlipidemia    Hypothyroidism    Long-term current use of benzodiazepine 04/05/2023   Unspecified hypothyroidism 03/02/2013   Past Surgical History:  Procedure Laterality Date   ENDOMETRIAL ABLATION     ROTATOR CUFF REPAIR  20 years ago   WISDOM TOOTH EXTRACTION  09/22/1971   Patient Active Problem List   Diagnosis Date Noted   PTSD (post-traumatic stress disorder) 04/05/2023   Generalized anxiety disorder 04/05/2023   Major depressive disorder, single episode, moderate (HCC) 04/05/2023   Colon cancer screening 03/22/2023   Coronary artery calcification seen on CT scan 10/19/2019   Abnormal screening cardiac CT 11/30/2018   Severe obesity (BMI 35.0-39.9) with comorbidity (HCC) 08/24/2018   Essential hypertension 02/13/2016   Mixed hyperlipidemia 02/13/2016   Primary insomnia 05/25/2014   Unspecified hypothyroidism 03/02/2013   Pure hypercholesterolemia 03/02/2013    PCP: Julien Girt, PA-C  REFERRING PROVIDER: Oliver Barre, MD  REFERRING DIAG: M17.11 (ICD-10-CM) - Arthritis of right knee  THERAPY DIAG:  Chronic pain of right knee  Rationale for Evaluation and Treatment: Rehabilitation  ONSET DATE: March 2024  SUBJECTIVE:   SUBJECTIVE STATEMENT: Arrives to the clinic with c/o R knee  pain on occasion (see below). However, patient denies pain at the time of evaluation. Patient states that R quads and R calf feel weak. Condition started March 2024 and gradually gotten worse in time without known cause. Denies any trauma on the knee. Patient took Meloxicam but it did not help. Patient went to see her MD in April 2024 who drained her knee and gave her injections. Procedure helped briefly (2-3 days) but the pain came back. Recently, patient went to see another MD who did X-ray and revealed arthritis. MD gave injections which significantly helped and referred her to outpatient PT evaluation and management.  PERTINENT HISTORY: Anxiety, PTSD PAIN:  Are you having pain? Yes: NPRS scale: 7/10 Pain location: lateral side of R knee, across the joint line Pain description: sharp and aching Aggravating factors: riding horse, negotiating stairs (descending), prolonged sitting Relieving factors: laying down  PRECAUTIONS: None  RED FLAGS: Bowel or bladder incontinence: No   WEIGHT BEARING RESTRICTIONS: No  FALLS:  Has patient fallen in last 6 months? 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  No acute injuries are noted.  Mild valgus alignment overall.  There are advanced degenerative changes within all 3 compartments.  There are associated osteophytes.  There appears to be some subchondral cysts within the medial aspect of the knee.  No bony lesions.   Impression: Right knee x-rays with moderate to severe degenerative  changes  PATIENT SURVEYS:  FOTO 46.31  COGNITION: Overall cognitive status: Within functional limits for tasks assessed     SENSATION: Not tested  MUSCLE LENGTH: Hamstrings: moderate restriction on B Gastrocnemius: moderate restriction on B Piriformis: WFL  POSTURE:  Standing: B  genu valgus (R>L), slight pronation R foot  PALPATION: No tenderness on major bony landmarks of the R knee Hypomobile on R patella towards inferior and medial glide  LOWER EXTREMITY ROM:  Active ROM Right eval Left eval  Hip flexion [facility]  Hip extension [facility]  Hip abduction [facility]  Hip adduction    Hip internal rotation    Hip external rotation    Knee flexion 85 WFL  Knee extension [facility] WFL  Ankle dorsiflexion [facility] WFL  Ankle plantarflexion [facility] WFL  Ankle inversion    Ankle eversion     (Blank rows = not tested)  LOWER EXTREMITY MMT:  MMT Right eval Left eval  Hip flexion 3+ 4-  Hip extension 3+ 4-  Hip abduction 3+ 4-  Hip adduction    Hip internal rotation    Hip external rotation    Knee flexion 4+ 4+  Knee extension 3+ 4  Ankle dorsiflexion 4+ 4+  Ankle plantarflexion 4+ 4+  Ankle inversion    Ankle eversion     (Blank rows = not tested)  LOWER EXTREMITY SPECIAL TESTS:  Hip special tests: Ely's test: positive  and Piriformis test: negative Knee special tests: (+) Clark's test and Waldron test on R  FUNCTIONAL TESTS:  5 times sit to stand: 9.50 sec 2 minute walk test: 491 ft  GAIT: Distance walked: 491 ft Assistive device utilized: None Level of assistance: Complete Independence Comments: done during , no gait deviations/unsteadiness seen  STAIRS: alternating pattern on ascending, step-to pattern on descending, significant R out-toeing on R and knee valgus on R more prominent in descending, c/o more discomfort on descending.   TODAY'S TREATMENT:                                                                                                                               DATE:  06/09/23 Evaluation and patient education done    PATIENT EDUCATION:  Education details: Educated on the pathoanatomy of R knee pain and arthritis. Educated on the goals and course of rehab.  Person educated: Patient Education method: Explanation Education comprehension: verbalized understanding  HOME EXERCISE PROGRAM: None provided to date  ASSESSMENT:  CLINICAL IMPRESSION: Patient is a 67 y.o. female who was seen today for physical therapy evaluation and treatment for R knee pain due to  arthritis. Patient has degenerative changes on the R knee per X-ray further defined by difficulty with stair negotiation and horse riding due to pain, weakness, and decreased soft tissue extensibility. Skilled PT is required to address the impairments and functional limitations listed below focusing on improving knee mechanic by preventing excessive knee valgus on functional activities.     OBJECTIVE IMPAIRMENTS: decreased ROM, decreased strength, hypomobility, impaired flexibility, and pain.   ACTIVITY LIMITATIONS: carrying, lifting, bending, sitting, standing, squatting, and stairs  PARTICIPATION LIMITATIONS: meal prep, cleaning, laundry, driving, community activity, occupation, and yard work  PERSONAL FACTORS: Time since onset of injury/illness/exacerbation are also affecting patient's functional outcome.   REHAB POTENTIAL: Good  CLINICAL DECISION MAKING: Stable/uncomplicated  EVALUATION COMPLEXITY: Low   GOALS: Goals reviewed with patient? Yes  SHORT TERM GOALS: Target date: 06/23/23 Pt will demonstrate indep in HEP to facilitate carry-over of skilled services and improve functional outcomes   LONG TERM GOALS: Target date: 07/07/23  Pt will increase FOTO to at least 60 in order to demonstrate significant improvement in function related to stair negotiation, ambulation, and ADLs Baseline: 46.31 Goal status: INITIAL  2.  Pt will demonstrate increase in LE  strength to 4+ to facilitate ease and safety in ambulation Baseline: 3+/5 Goal status: INITIAL  3.  Patient will demonstrate increase in knee flex ROM to 110-120 degrees to facilitate ease in ambulation and stair negotiation Baseline: 85 degrees Goal status: INITIAL  4.  Pt will demonstrate improved flexibility in the LEs to mild restriction to facilitate ease in ambulation and ADLs Baseline: moderate restriction Goal status: INITIAL  5.  Pt will increase by at least 40 ft in order to demonstrate clinically significant improvement in community ambulation Baseline: 491 ft Goal status: INITIAL    PLAN:  PT FREQUENCY: 2x/week  PT DURATION: 4 weeks  PLANNED INTERVENTIONS: Therapeutic exercises, Therapeutic activity, Neuromuscular re-education, Balance training, Gait training, Patient/Family education, Self Care, Joint mobilization, Dry Needling, Electrical stimulation, Cryotherapy, Moist heat, Taping, and Manual therapy  PLAN FOR NEXT SESSION: Provide HEP. Begin LE strengthening, mobility, and flexibility activities emphasizing on preventing excessive knee valgus on functional activities.  Tish Frederickson. Nafeesah Lapaglia, PT, DPT, OCS Board-Certified Clinical Specialist in Orthopedic PT PT Compact Privilege # ([place]): [phone] [id number]  06/09/2023, 1:51 PM

## 2023-06-11 ENCOUNTER — Encounter: Payer: Self-pay | Admitting: Podiatrist

## 2023-06-11 ENCOUNTER — Ambulatory Visit (INDEPENDENT_AMBULATORY_CARE_PROVIDER_SITE_OTHER): Payer: Medicare Other | Admitting: Podiatrist

## 2023-06-11 ENCOUNTER — Ambulatory Visit: Payer: Medicare Other | Admitting: Podiatry

## 2023-06-11 DIAGNOSIS — B351 Tinea unguium: Secondary | ICD-10-CM | POA: Diagnosis not present

## 2023-06-11 DIAGNOSIS — L603 Nail dystrophy: Secondary | ICD-10-CM | POA: Diagnosis not present

## 2023-06-11 NOTE — Progress Notes (Signed)
Subjective: Chief Complaint  Patient presents with   Nail Problem    Follow up nail fungus hallux bilateral   "I've been using the ciclopirox and its been helping"   67 year old female presents for above concerns.  States the nail has grown out well and looks and feels much better   Objective: AAO x3, NAD DP/PT pulses palpable bilaterally, CRT less than 3 seconds On the hallux toenail right side worse than left nails hypertrophic, dystrophic with subungual debris present.  There is some clearing of the proximal nail folds.  No pain with calf compression, swelling, warmth, erythema  Assessment: 67 year old female with resolving onychodystrophy with use of ciclopirox Plan: -Recommended continued use of the ciclopirox as she has been doing.  She will call in the future as needed for follow-up  -Patient encouraged to call the office with any questions, concerns, change in symptoms.   Delories Heinz DPM

## 2023-06-14 ENCOUNTER — Encounter: Payer: Self-pay | Admitting: Podiatry

## 2023-06-14 ENCOUNTER — Ambulatory Visit (HOSPITAL_COMMUNITY): Payer: Medicare Other | Admitting: Physical Therapy

## 2023-06-14 DIAGNOSIS — M25561 Pain in right knee: Secondary | ICD-10-CM | POA: Diagnosis not present

## 2023-06-14 DIAGNOSIS — G8929 Other chronic pain: Secondary | ICD-10-CM

## 2023-06-14 NOTE — Therapy (Signed)
OUTPATIENT PHYSICAL THERAPY TREATMENT  Patient Name: Monique [name] MRN: [id number] DOB:[birth year], 67 y.o., female Today's Date: 06/14/2023  END OF SESSION:  PT End of Session - 06/14/23 1616     Visit Number 2    Number of Visits 8    Date for PT Re-Evaluation 07/07/23    Authorization Type Medicare (A&B) and AARP    PT Start Time 1517    PT Stop Time 1555    PT Time Calculation (min) 38 min    Activity Tolerance Patient tolerated treatment well    Behavior During Therapy [facility] for tasks assessed/performed              Past Medical History:  Diagnosis Date   Colon cancer screening 03/22/2023   Essential hypertension 02/13/2016   Hyperlipidemia    Hypothyroidism    Long-term current use of benzodiazepine 04/05/2023   Unspecified hypothyroidism 03/02/2013   Past Surgical History:  Procedure Laterality Date   ENDOMETRIAL ABLATION     ROTATOR CUFF REPAIR  20 years ago   WISDOM TOOTH EXTRACTION  09/22/1971   Patient Active Problem List   Diagnosis Date Noted   PTSD (post-traumatic stress disorder) 04/05/2023   Generalized anxiety disorder 04/05/2023   Major depressive disorder, single episode, moderate (HCC) 04/05/2023   Colon cancer screening 03/22/2023   Coronary artery calcification seen on CT scan 10/19/2019   Abnormal screening cardiac CT 11/30/2018   Severe obesity (BMI 35.0-39.9) with comorbidity (HCC) 08/24/2018   Essential hypertension 02/13/2016   Mixed hyperlipidemia 02/13/2016   Primary insomnia 05/25/2014   Unspecified hypothyroidism 03/02/2013   Pure hypercholesterolemia 03/02/2013    PCP: Julien Girt, PA-C  REFERRING PROVIDER: Oliver Barre, MD  REFERRING DIAG: M17.11 (ICD-10-CM) - Arthritis of right knee  THERAPY DIAG:  Chronic pain of right knee  Rationale for Evaluation and Treatment: Rehabilitation  ONSET DATE: March 2024  SUBJECTIVE:   SUBJECTIVE STATEMENT: Pt states she is doing well today, no pain currently.   States she climbs up and down on tractor multiple times [place] States she ordered a walking pad and should get it today.  Evaluation: Arrives to the clinic with c/o R knee pain on occasion (see below). However, patient denies pain at the time of evaluation. Patient states that R quads and R calf feel weak. Condition started March 2024 and gradually gotten worse in time without known cause. Denies any trauma on the knee. Patient took Meloxicam but it did not help. Patient went to see her MD in April 2024 who drained her knee and gave her injections. Procedure helped briefly (2-3 days) but the pain came back. Recently, patient went to see another MD who did X-ray and revealed arthritis. MD gave injections which significantly helped and referred her to outpatient PT evaluation and management.  PERTINENT HISTORY: Anxiety, PTSD PAIN:  Are you having pain? Yes: NPRS scale: 7/10 Pain location: lateral side of R knee, across the joint line Pain description: sharp and aching Aggravating factors: riding horse, negotiating stairs (descending), prolonged sitting Relieving factors: laying down  PRECAUTIONS: None  RED FLAGS: Bowel or bladder incontinence: No   WEIGHT BEARING RESTRICTIONS: No  FALLS:  Has patient fallen in last 6 months? 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  No acute injuries are noted.  Mild valgus alignment overall.  There are advanced degenerative changes within all 3 compartments.  There are  associated osteophytes.  There appears to be some subchondral cysts within the medial aspect of the knee.  No bony lesions.   Impression: Right knee x-rays with moderate to severe degenerative changes  PATIENT SURVEYS:  FOTO 46.31  COGNITION: Overall cognitive status: Within functional limits for tasks assessed     SENSATION: Not tested  MUSCLE LENGTH: Hamstrings: moderate restriction on B Gastrocnemius: moderate restriction on B Piriformis: WFL  POSTURE:  Standing: B  genu valgus (R>L), slight pronation R foot  PALPATION: No tenderness on major bony landmarks of the R knee Hypomobile on R patella towards inferior and medial glide  LOWER EXTREMITY ROM:  Active ROM Right eval Left eval  Hip flexion [facility]  Hip extension [facility]  Hip abduction [facility]  Hip adduction    Hip internal rotation    Hip external rotation    Knee flexion 85 WFL  Knee extension [facility] WFL  Ankle dorsiflexion [facility] WFL  Ankle plantarflexion [facility] WFL  Ankle inversion    Ankle eversion     (Blank rows = not tested)  LOWER EXTREMITY MMT:  MMT Right eval Left eval  Hip flexion 3+ 4-  Hip extension 3+ 4-  Hip abduction 3+ 4-  Hip adduction    Hip internal rotation    Hip external rotation    Knee flexion 4+ 4+  Knee extension 3+ 4  Ankle dorsiflexion 4+ 4+  Ankle plantarflexion 4+ 4+  Ankle inversion    Ankle eversion     (Blank rows = not tested)  LOWER EXTREMITY SPECIAL TESTS:  Hip special tests: Ely's test: positive  and Piriformis test: negative Knee special tests: (+) Clark's test and Waldron test on R  FUNCTIONAL TESTS:  5 times sit to stand: 9.50 sec 2 minute walk test: 491 ft  GAIT: Distance walked: 491 ft Assistive device utilized: None Level of assistance: Complete Independence Comments: done during , no gait deviations/unsteadiness seen  STAIRS: alternating pattern on ascending, step-to pattern on descending, significant R out-toeing on R and knee valgus on R more  prominent in descending, c/o more discomfort on descending.   TODAY'S TREATMENT:                                                                                                                              DATE:  06/14/23 Goal review Exercises - Supine Bridge  - 2 x daily - 7 x weekly - 1 sets - 10 reps - Supine Active Straight Leg Raise  - 2 x daily - 7 x weekly - 1  sets - 10 reps - Sidelying Hip Abduction  - 2 x daily - 7 x weekly - 1 sets - 10 reps - Seated Long Arc Quad  - 2 x daily - 7 x weekly - 1 sets - 10 reps - 5 sec hold - Seated March  - 2 x daily - 7 x weekly - 1 sets - 10 reps - Standing Hip Abduction with Counter Support  - 2 x daily - 7 x weekly - 1 sets - 10 reps - Standing Hip Extension with Counter Support  - 2 x daily - 7 x weekly - 1 sets - 10 reps - Sit to Stand  - 2 x daily - 7 x weekly - 1 sets - 10 reps Nustep level 5 seat 7 5 minutes  06/09/23 Evaluation and patient education done    PATIENT EDUCATION:  Education details: Educated on the pathoanatomy of R knee pain and arthritis. Educated on the goals and course of rehab.  Person educated: Patient Education method: Explanation Education comprehension: verbalized understanding  HOME EXERCISE PROGRAM: None provided at evaluation  Access Code: [email or web address] URL: [email or web address]  Date: 06/14/2023 Prepared by: Emeline Gins Exercises - Supine Bridge  - 2 x daily - 7 x weekly - 1 sets - 10 reps - Supine Active Straight Leg Raise  - 2 x daily - 7 x weekly - 1 sets - 10 reps - Sidelying Hip Abduction  - 2 x daily - 7 x weekly - 1 sets - 10 reps - Seated Long Arc Quad  - 2 x daily - 7 x weekly - 1 sets - 10 reps - 5 sec hold - Seated March  - 2 x daily - 7 x weekly - 1 sets - 10 reps - Standing Hip Abduction with Counter Support  - 2 x daily - 7 x weekly - 1 sets - 10 reps - Standing Hip Extension with Counter Support  - 2 x daily - 7 x weekly - 1 sets - 10 reps - Sit to Stand  - 2 x daily - 7 x  weekly - 1 sets - 10 reps   ASSESSMENT:  CLINICAL IMPRESSION: Goals reviewed and POC moving forward.  Began LE strengthening exercises and initiated HEP.   Pt required cues for general form and technique.  Provided written HEP instructions.  PT without any questions or pain at end of session.  Pt will continue to benefit from skilled therapy.   OBJECTIVE IMPAIRMENTS: decreased ROM, decreased strength, hypomobility, impaired flexibility, and pain.   ACTIVITY LIMITATIONS: carrying, lifting, bending, sitting, standing, squatting, and stairs  PARTICIPATION LIMITATIONS: meal prep, cleaning, laundry, driving, community activity, occupation, and yard work  PERSONAL FACTORS: Time since onset of injury/illness/exacerbation are also affecting patient's functional outcome.   REHAB POTENTIAL: Good  CLINICAL DECISION MAKING: Stable/uncomplicated  EVALUATION COMPLEXITY: Low   GOALS: Goals reviewed with patient? Yes  SHORT TERM GOALS: Target date: 06/23/23 Pt will demonstrate indep in HEP to facilitate carry-over of skilled services and improve functional outcomes    Goal Status:   IN PROGRESS  LONG TERM GOALS: Target date: 07/07/23  Pt will increase FOTO to at least 60 in order to demonstrate significant improvement in function related to stair negotiation, ambulation, and ADLs Baseline: 46.31 Goal status:  IN PROGRESS  2.  Pt will demonstrate increase in LE strength to 4+ to facilitate ease and safety in ambulation Baseline: 3+/5 Goal status:  IN PROGRESS  3.  Patient will demonstrate increase in knee  flex ROM to 110-120 degrees to facilitate ease in ambulation and stair negotiation Baseline: 85 degrees Goal status:  IN PROGRESS  4.  Pt will demonstrate improved flexibility in the LEs to mild restriction to facilitate ease in ambulation and ADLs Baseline: moderate restriction Goal status:  IN PROGRESS  5.  Pt will increase by at least 40 ft in order to demonstrate clinically  significant improvement in community ambulation Baseline: 491 ft Goal status:  IN PROGRESS    PLAN:  PT FREQUENCY: 2x/week  PT DURATION: 4 weeks  PLANNED INTERVENTIONS: Therapeutic exercises, Therapeutic activity, Neuromuscular re-education, Balance training, Gait training, Patient/Family education, Self Care, Joint mobilization, Dry Needling, Electrical stimulation, Cryotherapy, Moist heat, Taping, and Manual therapy  PLAN FOR NEXT SESSION: Progress LE strengthening, mobility, and flexibility activities emphasizing on preventing excessive knee valgus on functional activities.  Lurena Nida, PTA/CLT [facility] Ph: [phone]  06/14/2023, 4:18 PM

## 2023-06-18 ENCOUNTER — Ambulatory Visit (HOSPITAL_COMMUNITY): Payer: Medicare Other

## 2023-06-18 DIAGNOSIS — G8929 Other chronic pain: Secondary | ICD-10-CM

## 2023-06-18 DIAGNOSIS — M25561 Pain in right knee: Secondary | ICD-10-CM | POA: Diagnosis not present

## 2023-06-18 NOTE — Therapy (Signed)
OUTPATIENT PHYSICAL THERAPY TREATMENT  Patient Name: Monique [name] MRN: [id number] DOB:[birth year], 67 y.o., female Today's Date: 06/18/2023  END OF SESSION:  PT End of Session - 06/18/23 0937     Visit Number 3    Number of Visits 8    Date for PT Re-Evaluation 07/07/23    Authorization Type Medicare (A&B) and AARP    PT Start Time 0930    PT Stop Time 1010    PT Time Calculation (min) 40 min    Activity Tolerance Patient tolerated treatment well    Behavior During Therapy [facility] for tasks assessed/performed            Past Medical History:  Diagnosis Date   Colon cancer screening 03/22/2023   Essential hypertension 02/13/2016   Hyperlipidemia    Hypothyroidism    Long-term current use of benzodiazepine 04/05/2023   Unspecified hypothyroidism 03/02/2013   Past Surgical History:  Procedure Laterality Date   ENDOMETRIAL ABLATION     ROTATOR CUFF REPAIR  20 years ago   WISDOM TOOTH EXTRACTION  09/22/1971   Patient Active Problem List   Diagnosis Date Noted   PTSD (post-traumatic stress disorder) 04/05/2023   Generalized anxiety disorder 04/05/2023   Major depressive disorder, single episode, moderate (HCC) 04/05/2023   Colon cancer screening 03/22/2023   Coronary artery calcification seen on CT scan 10/19/2019   Abnormal screening cardiac CT 11/30/2018   Severe obesity (BMI 35.0-39.9) with comorbidity (HCC) 08/24/2018   Essential hypertension 02/13/2016   Mixed hyperlipidemia 02/13/2016   Primary insomnia 05/25/2014   Unspecified hypothyroidism 03/02/2013   Pure hypercholesterolemia 03/02/2013    PCP: Julien Girt, PA-C  REFERRING PROVIDER: Oliver Barre, MD  REFERRING DIAG: M17.11 (ICD-10-CM) - Arthritis of right knee  THERAPY DIAG:  Chronic pain of right knee  Rationale for Evaluation and Treatment: Rehabilitation  ONSET DATE: March 2024  SUBJECTIVE:   SUBJECTIVE STATEMENT: Patient denies any pain today and has been doing  well  Evaluation: Arrives to the clinic with c/o R knee pain on occasion (see below). However, patient denies pain at the time of evaluation. Patient states that R quads and R calf feel weak. Condition started March 2024 and gradually gotten worse in time without known cause. Denies any trauma on the knee. Patient took Meloxicam but it did not help. Patient went to see her MD in April 2024 who drained her knee and gave her injections. Procedure helped briefly (2-3 days) but the pain came back. Recently, patient went to see another MD who did X-ray and revealed arthritis. MD gave injections which significantly helped and referred her to outpatient PT evaluation and management.  PERTINENT HISTORY: Anxiety, PTSD PAIN:  Are you having pain? Yes: NPRS scale: 7/10 Pain location: lateral side of R knee, across the joint line Pain description: sharp and aching Aggravating factors: riding horse, negotiating stairs (descending), prolonged sitting Relieving factors: laying down  PRECAUTIONS: None  RED FLAGS: Bowel or bladder incontinence: No   WEIGHT BEARING RESTRICTIONS: No  FALLS:  Has patient fallen in last 6 months? 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  No acute injuries are noted.  Mild valgus alignment overall.  There are advanced degenerative changes within all 3 compartments.  There are associated osteophytes.  There appears to be some subchondral cysts within the medial aspect of the knee.  No bony lesions.   Impression:  Right knee x-rays with moderate to severe degenerative changes  PATIENT SURVEYS:  FOTO 46.31  COGNITION: Overall cognitive status: Within functional limits for tasks assessed     SENSATION: Not tested  MUSCLE LENGTH: Hamstrings: moderate restriction on B Gastrocnemius: moderate restriction on B Piriformis: WFL  POSTURE:  Standing: B  genu valgus (R>L), slight pronation R foot  PALPATION: No tenderness on major bony landmarks of the R knee Hypomobile on R patella towards inferior and medial glide  LOWER EXTREMITY ROM:  Active ROM Right eval Left eval  Hip flexion [facility]  Hip extension [facility]  Hip abduction [facility]  Hip adduction    Hip internal rotation    Hip external rotation    Knee flexion 85 WFL  Knee extension [facility] WFL  Ankle dorsiflexion [facility] WFL  Ankle plantarflexion [facility] WFL  Ankle inversion    Ankle eversion     (Blank rows = not tested)  LOWER EXTREMITY MMT:  MMT Right eval Left eval  Hip flexion 3+ 4-  Hip extension 3+ 4-  Hip abduction 3+ 4-  Hip adduction    Hip internal rotation    Hip external rotation    Knee flexion 4+ 4+  Knee extension 3+ 4  Ankle dorsiflexion 4+ 4+  Ankle plantarflexion 4+ 4+  Ankle inversion    Ankle eversion     (Blank rows = not tested)  LOWER EXTREMITY SPECIAL TESTS:  Hip special tests: Ely's test: positive  and Piriformis test: negative Knee special tests: (+) Clark's test and Waldron test on R  FUNCTIONAL TESTS:  5 times sit to stand: 9.50 sec 2 minute walk test: 491 ft  GAIT: Distance walked: 491 ft Assistive device utilized: None Level of assistance: Complete Independence Comments: done during , no gait deviations/unsteadiness seen  STAIRS: alternating pattern on ascending, step-to pattern on descending, significant R out-toeing on R and knee valgus on R more prominent in descending, c/o more discomfort on descending.   TODAY'S TREATMENT:                                                                                                                               DATE:  06/18/23 NuStep level 5, seat 7, 5' Standing R quads stretch with a strap/towel x 30" x 3 Seated R calf stretch with a strap x 30" x 3 Seated R hamstring stretch x 30" x 3 Supine R patellar mobs on all planes, 2' total R SLR, with slight ER x 10 x 2 x 1 lb Bridging with hip abd, RTB x 3" x 10 x 2 Mini  squats with hip abd, RTB x 3" x 10 x 2 R lunges with medial pull from RTB x 3" x 10 x 2  06/14/23 Goal review Exercises - Supine Bridge  - 2 x daily - 7 x weekly - 1 sets - 10 reps - Supine Active Straight Leg Raise  - 2 x daily - 7 x weekly - 1 sets - 10 reps - Sidelying Hip Abduction  - 2 x daily - 7 x weekly - 1 sets - 10 reps - Seated Long Arc Quad  - 2 x daily - 7 x weekly - 1 sets - 10 reps - 5 sec hold - Seated March  - 2 x daily - 7 x weekly - 1 sets - 10 reps - Standing Hip Abduction with Counter Support  - 2 x daily - 7 x weekly - 1 sets - 10 reps - Standing Hip Extension with Counter Support  - 2 x daily - 7 x weekly - 1 sets - 10 reps - Sit to Stand  - 2 x daily - 7 x weekly - 1 sets - 10 reps Nustep level 5 seat 7 5 minutes  06/09/23 Evaluation and patient education done    PATIENT EDUCATION:  Education details: Educated on the pathoanatomy of R knee pain and arthritis. Educated on the goals and course of rehab.  Person educated: Patient Education method: Explanation Education comprehension: verbalized understanding  HOME EXERCISE PROGRAM: None provided at evaluation  Access Code: [email or web address] URL: [email or web address] 06/18/2023 - Seated Calf Stretch with Strap  - 1-2 x daily - 7 x weekly - 3 reps - 30 hold - Standing Quad Stretch with Towel and Arm Support  - 1-2 x daily - 7 x weekly - 3 reps - 30 hold - Seated Hamstring Stretch  - 1-2 x daily - 7 x weekly - 3 reps - 30 hold - Bridge with Hip Abduction and Resistance  - 1-2 x daily - 7 x weekly - 2 sets - 10 reps - 3 hold -  Clamshell with Resistance  - 1-2 x daily - 7 x weekly - 2 sets - 10 reps - Mini Squat with Counter Support  - 1-2 x daily - 7 x weekly - 2 sets - 10 reps - 3 hold   Date: 06/14/2023 Prepared by: Emeline Gins Exercises - Supine Bridge  - 2 x daily - 7 x weekly - 1 sets - 10 reps - Supine Active Straight Leg Raise  - 2 x daily - 7 x weekly - 1 sets - 10 reps - Sidelying Hip Abduction  - 2 x daily - 7 x weekly - 1 sets - 10 reps - Seated Long Arc Quad  - 2 x daily - 7 x weekly - 1 sets - 10 reps - 5 sec hold - Seated March  - 2 x daily - 7 x weekly - 1 sets - 10 reps - Standing Hip Abduction with Counter Support  - 2 x daily - 7 x weekly - 1 sets - 10 reps - Standing Hip Extension with Counter Support  - 2 x daily - 7 x weekly - 1 sets - 10 reps - Sit to Stand  - 2 x daily - 7 x weekly - 1 sets - 10 reps   ASSESSMENT:  CLINICAL IMPRESSION: Interventions today were geared towards LE strengthening and flexibility. Tolerated all activities without worsening of symptoms. However, patient reports of some grinding on the knee without pain when doing mini squats  and lunges. Demonstrated appropriate levels of fatigue. Provided slight amount of cueing to ensure correct execution of activity with good carry-over. To date, skilled PT is required to address the impairments and improve function.   OBJECTIVE IMPAIRMENTS: decreased ROM, decreased strength, hypomobility, impaired flexibility, and pain.   ACTIVITY LIMITATIONS: carrying, lifting, bending, sitting, standing, squatting, and stairs  PARTICIPATION LIMITATIONS: meal prep, cleaning, laundry, driving, community activity, occupation, and yard work  PERSONAL FACTORS: Time since onset of injury/illness/exacerbation are also affecting patient's functional outcome.   REHAB POTENTIAL: Good  CLINICAL DECISION MAKING: Stable/uncomplicated  EVALUATION COMPLEXITY: Low   GOALS: Goals reviewed with patient? Yes  SHORT TERM GOALS: Target date:  06/23/23 Pt will demonstrate indep in HEP to facilitate carry-over of skilled services and improve functional outcomes    Goal Status:   IN PROGRESS  LONG TERM GOALS: Target date: 07/07/23  Pt will increase FOTO to at least 60 in order to demonstrate significant improvement in function related to stair negotiation, ambulation, and ADLs Baseline: 46.31 Goal status:  IN PROGRESS  2.  Pt will demonstrate increase in LE strength to 4+ to facilitate ease and safety in ambulation Baseline: 3+/5 Goal status:  IN PROGRESS  3.  Patient will demonstrate increase in knee flex ROM to 110-120 degrees to facilitate ease in ambulation and stair negotiation Baseline: 85 degrees Goal status:  IN PROGRESS  4.  Pt will demonstrate improved flexibility in the LEs to mild restriction to facilitate ease in ambulation and ADLs Baseline: moderate restriction Goal status:  IN PROGRESS  5.  Pt will increase by at least 40 ft in order to demonstrate clinically significant improvement in community ambulation Baseline: 491 ft Goal status:  IN PROGRESS    PLAN:  PT FREQUENCY: 2x/week  PT DURATION: 4 weeks  PLANNED INTERVENTIONS: Therapeutic exercises, Therapeutic activity, Neuromuscular re-education, Balance training, Gait training, Patient/Family education, Self Care, Joint mobilization, Dry Needling, Electrical stimulation, Cryotherapy, Moist heat, Taping, and Manual therapy  PLAN FOR NEXT SESSION: Progress LE strengthening, mobility, and flexibility activities emphasizing on preventing excessive knee valgus on functional activities.  Tish Frederickson. Jeiden Daughtridge, PT, DPT, OCS Board-Certified Clinical Specialist in Orthopedic PT PT Compact Privilege # ([place]): [id number]   06/18/2023, 9:38 AM

## 2023-06-21 ENCOUNTER — Telehealth (INDEPENDENT_AMBULATORY_CARE_PROVIDER_SITE_OTHER): Payer: Medicare Other | Admitting: Psychiatry

## 2023-06-21 ENCOUNTER — Encounter (HOSPITAL_COMMUNITY): Payer: Self-pay | Admitting: Psychiatry

## 2023-06-21 DIAGNOSIS — F4310 Post-traumatic stress disorder, unspecified: Secondary | ICD-10-CM | POA: Diagnosis not present

## 2023-06-21 DIAGNOSIS — F5101 Primary insomnia: Secondary | ICD-10-CM | POA: Diagnosis not present

## 2023-06-21 DIAGNOSIS — F411 Generalized anxiety disorder: Secondary | ICD-10-CM

## 2023-06-21 DIAGNOSIS — F321 Major depressive disorder, single episode, moderate: Secondary | ICD-10-CM | POA: Diagnosis not present

## 2023-06-21 DIAGNOSIS — E039 Hypothyroidism, unspecified: Secondary | ICD-10-CM

## 2023-06-21 NOTE — Progress Notes (Signed)
BH MD Outpatient Progress Note  06/21/2023 10:14 AM Monique [name]  MRN:  [id number]  Assessment:  Monique [name] presents for follow-up evaluation. Today, 06/21/23, patient reports ongoing stability of mood with benefit of sleeping more regularly with increased dose of trazodone.  Main consideration at this time is figuring out if she wants to do increased work as her replacement quit.  Engaging in PT for her knee which has been helpful for mobility.  She is overall satisfied with medication regimen so we will keep Wellbutrin the same for now.  She will still coordinate with PCP to get blood work when she sees him next.  Still not interested in psychotherapy at this time.  Follow-up in 2 months.  For safety, her acute risk factors for suicide are: living alone, current diagnosis of depression and PTSD. Her chronic risk factors for suicide are: access to firearms, older age, living alone, chronic prescription benzodiazepine use. Her protective factors are: beloved pets in the home, supportive family, employment, actively seeking and engaging with mental health care, firearms are secured when not in use, no suicidal ideation in session. While future events cannot be fully predicted she does not currently meet IVC criteria and can be continued as an outpatient.  Identifying Information: Monique [name] is a 67 y.o. female with a history of primary insomnia, history of long-term use of prescription benzodiazepine, generalized anxiety disorder, PTSD with single episode of major depressive disorder after her grandson's death in 05-Feb-2024hypothyroidism who is an established patient with [facility] participating in follow-up via video conferencing. Initial evaluation of depression, insomnia, anxiety on 04/05/23; please see that note for full case formulation.  Patient reported decades of primary insomnia with previous trials of Ambien and Xanax collateral of which was  present for 10 to 12 years.  Insomnia at time of initial appointment was multifactorial with taking Wellbutrin that had been started in May 2024 at nighttime along with the chronic Xanax use.  This was in addition to the single episode of major depressive disorder which was the result of the sudden unexpected death of her grandson from a motor vehicle collision in Oct 26, 2022.  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  Outside of this and prior to the traumatic event she had generalized anxiety disorder for many years.  Panic attacks occurred every 3 to 4 years.  She was amenable to taper of Xanax we will plan to discontinue after 1 month decreased dose.  Wellbutrin was switched to the morning as well and added trazodone to replace Xanax for sleep aid.  She tried 4 sessions of psychotherapy previously and did not want to pursue at time of initial appointment.  She reported a family history of Parkinson's disease in her father and paternal uncle so we will need to keep this in mind prescribing.  Concentration impairment could be from depression and chronic poor sleep but was also noted to eat roughly 2 meals per day and small meals at that.  Coordinated with PCP for blood work and possible nutrition referral.  Able to successfully come off Xanax with cross titration of trazodone.  Plan:  # PTSD  single episode may depressive disorder, moderate Past medication trials: See medication trials below Status of problem: Improving Interventions: -- Continue Wellbutrin XL 150 mg daily --Coordinate with PCP for vitamin D, B12, folate,  iron panel and consider nutrition referral  # Primary insomnia Past medication trials: Ambien, Xanax Status of problem: Improving Interventions: --continue trazodone 50-100mg  nightly (s7/15/24, i8/19/24)  # Generalized anxiety disorder Past medication trials:  Status  of problem: Improving Interventions: -- Wellbutrin as above  # Hypothyroidism Past medication trials:  Status of problem: Chronic and stable Interventions: -- Continue Synthroid per PCP  Patient was given contact information for behavioral health clinic and was instructed to call 911 for emergencies.   Subjective:  Chief Complaint:  Chief Complaint  Patient presents with   Anxiety   Stress   Follow-up   Depression    Interval History: Things have been so far so good and making progress. Able to sleep most nights which is helping her mood quite a bit. Sleep up to 5.5 hrs. Thinks current combo of wellbutrin and trazodone are working well with no side effects. Hasn't gotten blood work yet but will see PCP in November. Her replacement at work quit so she has been asked to come back full time for a few months and still deciding about whether she wants to do it or not. Going to PT for her knee which has been helpful.   Visit Diagnosis:    ICD-10-CM   1. PTSD (post-traumatic stress disorder)  F43.10     2. Generalized anxiety disorder  F41.1     3. Major depressive disorder, single episode, moderate (HCC)  F32.1     4. Primary insomnia  F51.01        Past Psychiatric History:  Diagnoses: primary insomnia, history of long-term use of prescription benzodiazepine, generalized anxiety disorder, PTSD with single episode of major depressive disorder after her grandson's death in 2024/01/23hypothyroidism Medication trials: wellbutrin (effective) started in May 2024; prozac maybe in college, xanax (for insomnia), ambien (effective), trazodone (effective) Previous psychiatrist/therapist: yes to therapy Hospitalizations: none Suicide attempts: none SIB: none Hx of violence towards others: none Current access to guns: yes secured in a gunsafe Hx of trauma/abuse: none Substance use: none  Past Medical History:  Past Medical History:  Diagnosis Date   Colon cancer screening  03/22/2023   Essential hypertension 02/13/2016   Hyperlipidemia    Hypothyroidism    Long-term current use of benzodiazepine 04/05/2023   Unspecified hypothyroidism 03/02/2013    Past Surgical History:  Procedure Laterality Date   ENDOMETRIAL ABLATION     ROTATOR CUFF REPAIR  20 years ago   WISDOM TOOTH EXTRACTION  09/22/1971    Family Psychiatric History: two children with ADHD, father and paternal uncle with Parkinson's disease   Family History:  Family History  Problem Relation Age of Onset   Heart Problems Mother    Heart attack Father        multiple   Breast cancer Neg Hx    Colon cancer Neg Hx     Social History:  Academic/Vocational: Chief Financial Officer, mostly retired  Social History   Socioeconomic History   Marital status: Divorced    Spouse name: Not on file   Number of children: Not on file   Years of education: Not on file   Highest education level: Not on file  Occupational History   Not on file  Tobacco Use   Smoking status: Never   Smokeless tobacco: Never  Substance and Sexual Activity   Alcohol use: Yes    Alcohol/week: 0.0 - 1.0 standard drinks of alcohol    Comment: 1 unit of alcohol every 3 to 4 months  Drug use: Never   Sexual activity: Never    Partners: Male    Birth control/protection: Post-menopausal  Other Topics Concern   Not on file  Social History Narrative   Not on file   Social Determinants of Health   Financial Resource Strain: Low Risk  (03/08/2023)   Received from [facility], [facility]   Overall Financial Resource Strain (CARDIA)    Difficulty of Paying Living Expenses: Not hard at all  Food Insecurity: No Food Insecurity (03/08/2023)   Received from [facility], [facility]   Hunger Vital Sign    Worried About Running Out of Food in the Last Year: Never true    Ran Out of Food in the Last Year: Never true  Transportation Needs: No Transportation Needs (03/08/2023)   Received from [facility],  [facility]   PRAPARE - Transportation    Lack of Transportation (Medical): No    Lack of Transportation (Non-Medical): No  Physical Activity: Sufficiently Active (03/08/2023)   Received from [facility], [facility]   Exercise Vital Sign    Days of Exercise per Week: 5 days    Minutes of Exercise per Session: 30 min  Recent Concern: Physical Activity - Insufficiently Active (02/02/2023)   Received from [facility]   Exercise Vital Sign    Days of Exercise per Week: 2 days    Minutes of Exercise per Session: 30 min  Stress: Stress Concern Present (03/08/2023)   Received from [facility], [facility] - Occupational Stress Questionnaire    Feeling of Stress : Rather much  Social Connections: Somewhat Isolated (03/08/2023)   Received from [facility], [facility]   Social Network    How would you rate your social network (family, work, friends)?: Restricted participation with some degree of social isolation    Allergies:  Allergies  Allergen Reactions   Augmentin [Amoxicillin-Pot Clavulanate] Nausea And Vomiting   Ace Inhibitors Cough   Terbinafine Diarrhea    Current Medications: Current Outpatient Medications  Medication Sig Dispense Refill   amLODipine (NORVASC) 2.5 MG tablet TAKE 1 TABLET BY MOUTH EVERY DAY 90 tablet 3   aspirin EC 81 MG tablet Take 1 tablet (81 mg total) by mouth daily. 90 tablet 3   buPROPion (WELLBUTRIN XL) 150 MG 24 hr tablet Take 1 tablet (150 mg total) by mouth every morning. 90 tablet 0   ciclopirox (PENLAC) 8 % solution APPLY TOPICALLY AT BEDTIME. APPLY OVER NAIL AND SURROUNDING SKIN. APPLY DAILY OVER PREVIOUS COAT. AFTER SEVEN (7) DAYS, MAY REMOVE WITH ALCOHOL AND CONTINUE CYCLE. 6.6 mL 2   fluticasone (FLONASE) 50 MCG/ACT nasal spray Place 1 spray into both nostrils as needed for allergies.      levothyroxine (SYNTHROID) 150 MCG tablet Take 150 mcg by mouth daily before breakfast.     Multiple  Vitamin (MULTIVITAMIN) tablet Take 1 tablet by mouth daily.     naproxen sodium (ALEVE) 220 MG tablet Take 220 mg by mouth daily as needed (pain).     rosuvastatin (CRESTOR) 20 MG tablet TAKE 1 TABLET BY MOUTH EVERY DAY 90 tablet 3   traZODone (DESYREL) 100 MG tablet TAKE 1/2 OR 1 TABLET BY MOUTH NIGHTLY. 90 tablet 0   valsartan-hydrochlorothiazide (DIOVAN-HCT) 320-12.5 MG tablet TAKE 1 TABLET BY MOUTH EVERY DAY     No current facility-administered medications for this visit.    ROS: Review of Systems  Constitutional:  Positive for appetite change. Negative for unexpected weight  change.  Endocrine: Positive for cold intolerance and heat intolerance. Negative for polyphagia.  Musculoskeletal:  Positive for arthralgias. Negative for back pain.  Skin:        No hair loss  Psychiatric/Behavioral:  Positive for sleep disturbance. Negative for decreased concentration, dysphoric mood, hallucinations, self-injury and suicidal ideas. The patient is nervous/anxious.     Objective:  Psychiatric Specialty Exam: Last menstrual period 08/02/2012.There is no height or weight on file to calculate BMI.  General Appearance: Casual, Fairly Groomed, and wearing glasses.  Appears stated age  Eye Contact:  Good  Speech:  Clear and Coherent and Normal Rate  Volume:  Normal  Mood:   "So far so good"  Affect:  Appropriate, Congruent, Constricted, and improving range.  Significantly brighter than previously  Thought Content: Logical and Hallucinations: None   Suicidal Thoughts:  No  Homicidal Thoughts:  No  Thought Process:  Coherent, Goal Directed, and Linear  Orientation:  Full (Time, Place, and Person)    Memory:  Grossly intact   Judgment:  Fair  Insight:  Fair  Concentration:  Concentration: Good  Recall:  not formally assessed   Fund of Knowledge: Fair  Language: Good  Psychomotor Activity:  Normal  Akathisia:  No  AIMS (if indicated): not done  Assets:  Communication Skills Desire for  Improvement Financial Resources/Insurance Housing Leisure Time Physical Health Resilience Social Support Talents/Skills Transportation Vocational/Educational  ADL's:  Intact  Cognition: WNL  Sleep:  Poor but improving   PE: General: sits comfortably in view of camera; no acute distress  Pulm: no increased work of breathing on room air  MSK: all extremity movements appear intact  Neuro: no focal neurological deficits observed  Gait & Station: unable to assess by video    Metabolic Disorder Labs: No results found for: "HGBA1C", "MPG" No results found for: "PROLACTIN" No results found for: "CHOL", "TRIG", "HDL", "CHOLHDL", "VLDL", "LDLCALC" No results found for: "TSH"  Therapeutic Level Labs: No results found for: "LITHIUM" No results found for: "VALPROATE" No results found for: "CBMZ"  Screenings:  PHQ2-9    Flowsheet Row Office Visit from 04/05/2023 in [facility] Total Score 4  PHQ-9 Total Score 18      Flowsheet Row Office Visit from 04/05/2023 in [facility] at [place]  C-SSRS RISK CATEGORY No Risk       Collaboration of Care: Collaboration of Care: Medication Management AEB as above and Primary Care Provider AEB as above  Patient/Guardian was advised Release of Information must be obtained prior to any record release in order to collaborate their care with an outside provider. Patient/Guardian was advised if they have not already done so to contact the registration department to sign all necessary forms in order for [place] to release information regarding their care.   Consent: Patient/Guardian gives verbal consent for treatment and assignment of benefits for services provided during this visit. Patient/Guardian expressed understanding and agreed to proceed.   Televisit via video: I connected with patient on 06/21/23 at 10:00 AM EDT by a video enabled telemedicine application and  verified that I am speaking with the correct person using two identifiers.  Location: Patient: Home in [place] Provider: remote office in [place]   I discussed the limitations of evaluation and management by telemedicine and the availability of in person appointments. The patient expressed understanding and agreed to proceed.  I discussed the assessment and treatment plan with the patient. The patient was provided  an opportunity to ask questions and all were answered. The patient agreed with the plan and demonstrated an understanding of the instructions.   The patient was advised to call back or seek an in-person evaluation if the symptoms worsen or if the condition fails to improve as anticipated.  I provided 15 minutes dedicated to the care of this patient via video on the date of this encounter to include chart review, face-to-face time with the patient, medication management/counseling.  Monique Lincoln, MD 06/21/2023, 10:14 AM

## 2023-06-21 NOTE — Patient Instructions (Signed)
We did not make any medication changes today.  Keep up the good work with work life balance.

## 2023-06-23 ENCOUNTER — Ambulatory Visit (HOSPITAL_COMMUNITY): Payer: Medicare Other | Attending: Orthopedic Surgery | Admitting: Physical Therapy

## 2023-06-23 DIAGNOSIS — G8929 Other chronic pain: Secondary | ICD-10-CM | POA: Diagnosis present

## 2023-06-23 DIAGNOSIS — M25561 Pain in right knee: Secondary | ICD-10-CM | POA: Diagnosis present

## 2023-06-23 NOTE — Therapy (Signed)
OUTPATIENT PHYSICAL THERAPY TREATMENT  Patient Name: Monique [name] MRN: [id number] DOB:[birth year], 67 y.o., female Today's Date: 06/23/2023  END OF SESSION:  PT End of Session - 06/23/23 0943     Visit Number 4    Number of Visits 8    Date for PT Re-Evaluation 07/07/23    Authorization Type Medicare (A&B) and AARP    PT Start Time [phone]    PT Stop Time 1015    PT Time Calculation (min) 33 min    Activity Tolerance Patient tolerated treatment well    Behavior During Therapy [facility] for tasks assessed/performed            Past Medical History:  Diagnosis Date   Colon cancer screening 03/22/2023   Essential hypertension 02/13/2016   Hyperlipidemia    Hypothyroidism    Long-term current use of benzodiazepine 04/05/2023   Unspecified hypothyroidism 03/02/2013   Past Surgical History:  Procedure Laterality Date   ENDOMETRIAL ABLATION     ROTATOR CUFF REPAIR  20 years ago   WISDOM TOOTH EXTRACTION  09/22/1971   Patient Active Problem List   Diagnosis Date Noted   PTSD (post-traumatic stress disorder) 04/05/2023   Generalized anxiety disorder 04/05/2023   Major depressive disorder, single episode, moderate (HCC) 04/05/2023   Colon cancer screening 03/22/2023   Coronary artery calcification seen on CT scan 10/19/2019   Abnormal screening cardiac CT 11/30/2018   Severe obesity (BMI 35.0-39.9) with comorbidity (HCC) 08/24/2018   Essential hypertension 02/13/2016   Mixed hyperlipidemia 02/13/2016   Primary insomnia 05/25/2014   Hypothyroidism 03/02/2013   Pure hypercholesterolemia 03/02/2013    PCP: Julien Girt, PA-C  REFERRING PROVIDER: Oliver Barre, MD  REFERRING DIAG: M17.11 (ICD-10-CM) - Arthritis of right knee  THERAPY DIAG:  Chronic pain of right knee  Rationale for Evaluation and Treatment: Rehabilitation  ONSET DATE: March 2024  SUBJECTIVE:   SUBJECTIVE STATEMENT: Patient states she overdone some of her stretches over the weekend and  held off on the exercises a couple days.  Currently 2/10, noticeable but not interfering with function.  Evaluation: Arrives to the clinic with c/o R knee pain on occasion (see below). However, patient denies pain at the time of evaluation. Patient states that R quads and R calf feel weak. Condition started March 2024 and gradually gotten worse in time without known cause. Denies any trauma on the knee. Patient took Meloxicam but it did not help. Patient went to see her MD in April 2024 who drained her knee and gave her injections. Procedure helped briefly (2-3 days) but the pain came back. Recently, patient went to see another MD who did X-ray and revealed arthritis. MD gave injections which significantly helped and referred her to outpatient PT evaluation and management.  PERTINENT HISTORY: Anxiety, PTSD PAIN:  Are you having pain? Yes: NPRS scale: 2/10 Pain location: lateral side of R knee, across the joint line Pain description: sharp and aching Aggravating factors: riding horse, negotiating stairs (descending), prolonged sitting Relieving factors: laying down  PRECAUTIONS: None  RED FLAGS: Bowel or bladder incontinence: No   WEIGHT BEARING RESTRICTIONS: No  FALLS:  Has patient fallen in last 6 months? 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  No acute injuries are noted.  Mild valgus alignment overall.  There are advanced degenerative changes within all 3 compartments.  There are associated osteophytes.  There appears to  be some subchondral cysts within the medial aspect of the knee.  No bony lesions.   Impression: Right knee x-rays with moderate to severe degenerative changes  PATIENT SURVEYS:  FOTO 46.31  COGNITION: Overall cognitive status: Within functional limits for tasks assessed     SENSATION: Not tested  MUSCLE LENGTH: Hamstrings: moderate restriction on B Gastrocnemius: moderate restriction on B Piriformis: WFL  POSTURE:  Standing: B  genu valgus (R>L), slight pronation R foot  PALPATION: No tenderness on major bony landmarks of the R knee Hypomobile on R patella towards inferior and medial glide  LOWER EXTREMITY ROM:  Active ROM Right eval Left eval  Hip flexion [facility]  Hip extension [facility]  Hip abduction [facility]  Hip adduction    Hip internal rotation    Hip external rotation    Knee flexion 85 WFL  Knee extension [facility] WFL  Ankle dorsiflexion [facility] WFL  Ankle plantarflexion [facility] WFL  Ankle inversion    Ankle eversion     (Blank rows = not tested)  LOWER EXTREMITY MMT:  MMT Right eval Left eval  Hip flexion 3+ 4-  Hip extension 3+ 4-  Hip abduction 3+ 4-  Hip adduction    Hip internal rotation    Hip external rotation    Knee flexion 4+ 4+  Knee extension 3+ 4  Ankle dorsiflexion 4+ 4+  Ankle plantarflexion 4+ 4+  Ankle inversion    Ankle eversion     (Blank rows = not tested)  LOWER EXTREMITY SPECIAL TESTS:  Hip special tests: Ely's test: positive  and Piriformis test: negative Knee special tests: (+) Clark's test and Waldron test on R  FUNCTIONAL TESTS:  5 times sit to stand: 9.50 sec 2 minute walk test: 491 ft  GAIT: Distance walked: 491 ft Assistive device utilized: None Level of assistance: Complete Independence Comments: done during , no gait deviations/unsteadiness seen  STAIRS: alternating pattern on ascending, step-to pattern on descending, significant R out-toeing on R and knee valgus on R more prominent in descending, c/o more  discomfort on descending.   TODAY'S TREATMENT:                                                                                                                              DATE:  06/23/23 Supine: Bridging 3"hold x 10 x 2  SLR 2X10 each  Hamstring stretch with dog leash 3X30"  Seated:  sit to stands  LAQ 2X10X5" holds  Marching alternating 10X2 Standing: hip abduction 2X10 each Nustep level 5, seat 7 [place] 5 minutes  06/18/23 NuStep level 5, seat  7, 5' Standing R quads stretch with a strap/towel x 30" x 3 Seated R calf stretch with a strap x 30" x 3 Seated R hamstring stretch x 30" x 3 Supine R patellar mobs on all planes, 2' total R SLR, with slight ER x 10 x 2 x 1 lb Bridging with hip abd, RTB x 3" x 10 x 2 Mini squats with hip abd, RTB x 3" x 10 x 2 R lunges with medial pull from RTB x 3" x 10 x 2  06/14/23 Goal review Exercises - Supine Bridge  - 2 x daily - 7 x weekly - 1 sets - 10 reps - Supine Active Straight Leg Raise  - 2 x daily - 7 x weekly - 1 sets - 10 reps - Sidelying Hip Abduction  - 2 x daily - 7 x weekly - 1 sets - 10 reps - Seated Long Arc Quad  - 2 x daily - 7 x weekly - 1 sets - 10 reps - 5 sec hold - Seated March  - 2 x daily - 7 x weekly - 1 sets - 10 reps - Standing Hip Abduction with Counter Support  - 2 x daily - 7 x weekly - 1 sets - 10 reps - Standing Hip Extension with Counter Support  - 2 x daily - 7 x weekly - 1 sets - 10 reps - Sit to Stand  - 2 x daily - 7 x weekly - 1 sets - 10 reps Nustep level 5 seat 7 5 minutes  06/09/23 Evaluation and patient education done    PATIENT EDUCATION:  Education details: Educated on the pathoanatomy of R knee pain and arthritis. Educated on the goals and course of rehab.  Person educated: Patient Education method: Explanation Education comprehension: verbalized understanding  HOME EXERCISE PROGRAM: None provided at evaluation  Access Code: [email or web address] URL:  [email or web address] 06/18/2023 - Seated Calf Stretch with Strap  - 1-2 x daily - 7 x weekly - 3 reps - 30 hold - Standing Quad Stretch with Towel and Arm Support  - 1-2 x daily - 7 x weekly - 3 reps - 30 hold - Seated Hamstring Stretch  - 1-2 x daily - 7 x weekly - 3 reps - 30 hold - Bridge with Hip Abduction and Resistance  - 1-2 x daily - 7 x weekly - 2 sets - 10 reps - 3 hold - Clamshell with Resistance  - 1-2 x daily - 7 x weekly - 2 sets - 10 reps - Mini Squat with Counter Support  - 1-2 x daily - 7 x weekly - 2 sets - 10 reps - 3 hold   Date: 06/14/2023 Prepared by: Emeline Gins Exercises - Supine Bridge  - 2 x daily - 7 x weekly - 1 sets - 10 reps - Supine Active Straight Leg Raise  - 2 x daily - 7 x weekly - 1 sets - 10 reps - Sidelying Hip Abduction  - 2 x daily - 7 x weekly - 1 sets - 10 reps - Seated Long Arc Quad  - 2 x daily - 7 x weekly - 1 sets - 10 reps - 5 sec hold - Seated March  - 2 x daily - 7 x weekly - 1 sets - 10 reps - Standing Hip Abduction with Counter Support  - 2 x daily - 7 x weekly - 1 sets - 10 reps - Standing Hip Extension with Counter Support  - 2 x daily -  7 x weekly - 1 sets - 10 reps - Sit to Stand  - 2 x daily - 7 x weekly - 1 sets - 10 reps   ASSESSMENT:  CLINICAL IMPRESSION: Pt arrived late for session. Continued to focus on LE strength and ROM activities. Resumed stretches and all other activity without complaints of pain or issues.  Demonstrated appropriate levels of fatigue. PT did require general cues for form. Pt will continue to benefit from skilled therapy to reduce pain and improve function.   OBJECTIVE IMPAIRMENTS: decreased ROM, decreased strength, hypomobility, impaired flexibility, and pain.   ACTIVITY LIMITATIONS: carrying, lifting, bending, sitting, standing, squatting, and stairs  PARTICIPATION LIMITATIONS: meal prep, cleaning, laundry, driving, community activity, occupation, and yard work  PERSONAL FACTORS: Time  since onset of injury/illness/exacerbation are also affecting patient's functional outcome.   REHAB POTENTIAL: Good  CLINICAL DECISION MAKING: Stable/uncomplicated  EVALUATION COMPLEXITY: Low   GOALS: Goals reviewed with patient? Yes  SHORT TERM GOALS: Target date: 06/23/23 Pt will demonstrate indep in HEP to facilitate carry-over of skilled services and improve functional outcomes    Goal Status:   IN PROGRESS  LONG TERM GOALS: Target date: 07/07/23  Pt will increase FOTO to at least 60 in order to demonstrate significant improvement in function related to stair negotiation, ambulation, and ADLs Baseline: 46.31 Goal status:  IN PROGRESS  2.  Pt will demonstrate increase in LE strength to 4+ to facilitate ease and safety in ambulation Baseline: 3+/5 Goal status:  IN PROGRESS  3.  Patient will demonstrate increase in knee flex ROM to 110-120 degrees to facilitate ease in ambulation and stair negotiation Baseline: 85 degrees Goal status:  IN PROGRESS  4.  Pt will demonstrate improved flexibility in the LEs to mild restriction to facilitate ease in ambulation and ADLs Baseline: moderate restriction Goal status:  IN PROGRESS  5.  Pt will increase by at least 40 ft in order to demonstrate clinically significant improvement in community ambulation Baseline: 491 ft Goal status:  IN PROGRESS    PLAN:  PT FREQUENCY: 2x/week  PT DURATION: 4 weeks  PLANNED INTERVENTIONS: Therapeutic exercises, Therapeutic activity, Neuromuscular re-education, Balance training, Gait training, Patient/Family education, Self Care, Joint mobilization, Dry Needling, Electrical stimulation, Cryotherapy, Moist heat, Taping, and Manual therapy  PLAN FOR NEXT SESSION: Progress LE strengthening, mobility, and flexibility activities emphasizing on preventing excessive knee valgus on functional activities.  Lurena Nida, PTA/CLT [facility] Ph:  [phone]  06/23/2023, 9:44 AM

## 2023-06-24 ENCOUNTER — Encounter (HOSPITAL_COMMUNITY): Payer: Medicare Other

## 2023-06-25 ENCOUNTER — Ambulatory Visit (HOSPITAL_COMMUNITY): Payer: Medicare Other

## 2023-06-25 DIAGNOSIS — M25561 Pain in right knee: Secondary | ICD-10-CM | POA: Diagnosis not present

## 2023-06-25 DIAGNOSIS — G8929 Other chronic pain: Secondary | ICD-10-CM

## 2023-06-25 NOTE — Therapy (Signed)
OUTPATIENT PHYSICAL THERAPY TREATMENT  Patient Name: Monique [name] MRN: [id number] DOB:1956-02-18, 67 y.o., female Today's Date: 06/25/2023  END OF SESSION:  PT End of Session - 06/25/23 1357     Visit Number 5    Number of Visits 8    Date for PT Re-Evaluation 07/07/23    Authorization Type Medicare (A&B) and AARP    PT Start Time 1357   late check in   PT Stop Time 1430    PT Time Calculation (min) 33 min    Activity Tolerance Patient tolerated treatment well    Behavior During Therapy [facility] for tasks assessed/performed            Past Medical History:  Diagnosis Date   Colon cancer screening 03/22/2023   Essential hypertension 02/13/2016   Hyperlipidemia    Hypothyroidism    Long-term current use of benzodiazepine 04/05/2023   Unspecified hypothyroidism 03/02/2013   Past Surgical History:  Procedure Laterality Date   ENDOMETRIAL ABLATION     ROTATOR CUFF REPAIR  20 years ago   WISDOM TOOTH EXTRACTION  09/22/1971   Patient Active Problem List   Diagnosis Date Noted   PTSD (post-traumatic stress disorder) 04/05/2023   Generalized anxiety disorder 04/05/2023   Major depressive disorder, single episode, moderate (HCC) 04/05/2023   Colon cancer screening 03/22/2023   Coronary artery calcification seen on CT scan 10/19/2019   Abnormal screening cardiac CT 11/30/2018   Severe obesity (BMI 35.0-39.9) with comorbidity (HCC) 08/24/2018   Essential hypertension 02/13/2016   Mixed hyperlipidemia 02/13/2016   Primary insomnia 05/25/2014   Hypothyroidism 03/02/2013   Pure hypercholesterolemia 03/02/2013    PCP: Julien Girt, PA-C  REFERRING PROVIDER: Oliver Barre, MD  REFERRING DIAG: M17.11 (ICD-10-CM) - Arthritis of right knee  THERAPY DIAG:  Chronic pain of right knee  Rationale for Evaluation and Treatment: Rehabilitation  ONSET DATE: March 2024  SUBJECTIVE:   SUBJECTIVE STATEMENT: Patient reports no knee pain today; states she is doing  well; exercises are going ok; late arrival today  Evaluation: Arrives to the clinic with c/o R knee pain on occasion (see below). However, patient denies pain at the time of evaluation. Patient states that R quads and R calf feel weak. Condition started March 2024 and gradually gotten worse in time without known cause. Denies any trauma on the knee. Patient took Meloxicam but it did not help. Patient went to see her MD in April 2024 who drained her knee and gave her injections. Procedure helped briefly (2-3 days) but the pain came back. Recently, patient went to see another MD who did X-ray and revealed arthritis. MD gave injections which significantly helped and referred her to outpatient PT evaluation and management.  PERTINENT HISTORY: Anxiety, PTSD PAIN:  Are you having pain? Yes: NPRS scale: 2/10 Pain location: lateral side of R knee, across the joint line Pain description: sharp and aching Aggravating factors: riding horse, negotiating stairs (descending), prolonged sitting Relieving factors: laying down  PRECAUTIONS: None  RED FLAGS: Bowel or bladder incontinence: No   WEIGHT BEARING RESTRICTIONS: No  FALLS:  Has patient fallen in last 6 months? 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  No acute injuries are noted.  Mild valgus alignment overall.  There are advanced degenerative changes within all 3 compartments.  There are associated osteophytes.  There appears to be some subchondral cysts within the medial aspect of  the knee.  No bony lesions.   Impression: Right knee x-rays with moderate to severe degenerative changes  PATIENT SURVEYS:  FOTO 46.31  COGNITION: Overall cognitive status: Within functional limits for tasks assessed     SENSATION: Not tested  MUSCLE LENGTH: Hamstrings: moderate restriction on B Gastrocnemius: moderate restriction on B Piriformis: WFL  POSTURE:  Standing: B  genu valgus (R>L), slight pronation R foot  PALPATION: No tenderness on major bony landmarks of the R knee Hypomobile on R patella towards inferior and medial glide  LOWER EXTREMITY ROM:  Active ROM Right eval Left eval  Hip flexion [facility]  Hip extension [facility]  Hip abduction [facility]  Hip adduction    Hip internal rotation    Hip external rotation    Knee flexion 85 WFL  Knee extension [facility] WFL  Ankle dorsiflexion [facility] WFL  Ankle plantarflexion [facility] WFL  Ankle inversion    Ankle eversion     (Blank rows = not tested)  LOWER EXTREMITY MMT:  MMT Right eval Left eval  Hip flexion 3+ 4-  Hip extension 3+ 4-  Hip abduction 3+ 4-  Hip adduction    Hip internal rotation    Hip external rotation    Knee flexion 4+ 4+  Knee extension 3+ 4  Ankle dorsiflexion 4+ 4+  Ankle plantarflexion 4+ 4+  Ankle inversion    Ankle eversion     (Blank rows = not tested)  LOWER EXTREMITY SPECIAL TESTS:  Hip special tests: Ely's test: positive  and Piriformis test: negative Knee special tests: (+) Clark's test and Waldron test on R  FUNCTIONAL TESTS:  5 times sit to stand: 9.50 sec 2 minute walk test: 491 ft  GAIT: Distance walked: 491 ft Assistive device utilized: None Level of assistance: Complete Independence Comments: done during , no gait deviations/unsteadiness seen  STAIRS: alternating pattern on ascending, step-to pattern on descending, significant R out-toeing on R and knee valgus on R more prominent in descending, c/o more discomfort on descending.   TODAY'S TREATMENT:                                                                                                                               DATE:  06/25/23 Nustep level 2, seat 7 [place] 5 minutes dynamic warm up  Seated Hamstring stretch with strap 5 x 20" Heel raises on incline 2 x 10 Slant board 5 x 20" 2# hip abduction 2 x 10 each 2# hip extension 2 x 10 each    06/23/23 Supine: Bridging 3"hold x 10 x 2  SLR 2X10 each  Hamstring stretch with dog leash 3X30"  Seated:  sit to stands  LAQ 2X10X5" holds  Marching alternating 10X2 Standing: hip abduction 2X10 each Nustep level 5, seat 7 [place] 5 minutes  06/18/23 NuStep level 5, seat 7, 5' Standing R quads stretch with a strap/towel x 30" x 3 Seated R calf stretch with a strap x 30" x 3 Seated R hamstring stretch x 30" x 3 Supine R patellar mobs on all planes, 2' total R SLR, with slight ER x 10 x 2 x 1 lb Bridging with hip abd, RTB x 3" x 10 x 2 Mini squats with hip abd, RTB x 3" x 10 x 2 R lunges with medial pull from RTB x 3" x 10 x 2  06/14/23 Goal review Exercises - Supine Bridge  - 2 x daily - 7 x weekly - 1 sets - 10 reps - Supine Active Straight Leg Raise  - 2 x daily - 7 x weekly - 1 sets - 10 reps - Sidelying Hip Abduction  - 2 x daily - 7 x weekly - 1 sets - 10 reps - Seated Long Arc Quad  - 2 x daily - 7 x weekly - 1 sets - 10 reps - 5 sec hold - Seated March  - 2 x daily - 7 x weekly - 1 sets - 10 reps - Standing Hip Abduction with Counter Support  - 2 x daily - 7 x weekly - 1 sets - 10 reps - Standing Hip Extension with Counter Support  - 2 x daily - 7 x weekly - 1 sets - 10 reps - Sit to Stand  - 2 x daily - 7 x weekly - 1 sets - 10 reps Nustep level 5 seat 7 5 minutes  06/09/23 Evaluation and patient education done    PATIENT EDUCATION:  Education details: Educated on the pathoanatomy of R knee pain and arthritis. Educated on the goals and course of rehab.  Person educated: Patient Education method: Explanation Education  comprehension: verbalized understanding  HOME EXERCISE PROGRAM: None provided at evaluation  Access Code: [email or web address] URL: [email or web address] 06/18/2023 - Seated Calf Stretch with Strap  - 1-2 x daily - 7 x weekly - 3 reps - 30 hold - Standing Quad Stretch with Towel and Arm Support  - 1-2 x daily - 7 x weekly - 3 reps - 30 hold - Seated Hamstring Stretch  - 1-2 x daily - 7 x weekly - 3 reps - 30 hold - Bridge with Hip Abduction and Resistance  - 1-2 x daily - 7 x weekly - 2 sets - 10 reps - 3 hold - Clamshell with Resistance  - 1-2 x daily - 7 x weekly - 2 sets - 10 reps - Mini Squat with Counter Support  - 1-2 x daily - 7 x weekly - 2 sets - 10 reps - 3 hold   Date: 06/14/2023 Prepared by: Emeline Gins Exercises - Supine Bridge  - 2 x daily - 7 x weekly - 1 sets - 10 reps - Supine Active Straight Leg Raise  - 2 x daily - 7 x weekly - 1 sets - 10 reps - Sidelying Hip Abduction  - 2 x daily - 7 x weekly - 1 sets - 10 reps - Seated Long Arc Quad  - 2 x daily - 7 x weekly - 1 sets - 10 reps - 5 sec hold - Seated March  - 2 x daily - 7  x weekly - 1 sets - 10 reps - Standing Hip Abduction with Counter Support  - 2 x daily - 7 x weekly - 1 sets - 10 reps - Standing Hip Extension with Counter Support  - 2 x daily - 7 x weekly - 1 sets - 10 reps - Sit to Stand  - 2 x daily - 7 x weekly - 1 sets - 10 reps   ASSESSMENT:  CLINICAL IMPRESSION: Late arrival.  Continued with lower extremity stretching and strengthening.  Progressed standing exercises today; added weight to hip abduction and extension today with good challenge.  Minimal cueing need for form with hip exercises today.   Pt will continue to benefit from skilled therapy to reduce pain and improve function.    OBJECTIVE IMPAIRMENTS: decreased ROM, decreased strength, hypomobility, impaired flexibility, and pain.   ACTIVITY LIMITATIONS: carrying, lifting, bending, sitting, standing, squatting, and  stairs  PARTICIPATION LIMITATIONS: meal prep, cleaning, laundry, driving, community activity, occupation, and yard work  PERSONAL FACTORS: Time since onset of injury/illness/exacerbation are also affecting patient's functional outcome.   REHAB POTENTIAL: Good  CLINICAL DECISION MAKING: Stable/uncomplicated  EVALUATION COMPLEXITY: Low   GOALS: Goals reviewed with patient? Yes  SHORT TERM GOALS: Target date: 06/23/23 Pt will demonstrate indep in HEP to facilitate carry-over of skilled services and improve functional outcomes    Goal Status:   IN PROGRESS  LONG TERM GOALS: Target date: 07/07/23  Pt will increase FOTO to at least 60 in order to demonstrate significant improvement in function related to stair negotiation, ambulation, and ADLs Baseline: 46.31 Goal status:  IN PROGRESS  2.  Pt will demonstrate increase in LE strength to 4+ to facilitate ease and safety in ambulation Baseline: 3+/5 Goal status:  IN PROGRESS  3.  Patient will demonstrate increase in knee flex ROM to 110-120 degrees to facilitate ease in ambulation and stair negotiation Baseline: 85 degrees Goal status:  IN PROGRESS  4.  Pt will demonstrate improved flexibility in the LEs to mild restriction to facilitate ease in ambulation and ADLs Baseline: moderate restriction Goal status:  IN PROGRESS  5.  Pt will increase by at least 40 ft in order to demonstrate clinically significant improvement in community ambulation Baseline: 491 ft Goal status:  IN PROGRESS    PLAN:  PT FREQUENCY: 2x/week  PT DURATION: 4 weeks  PLANNED INTERVENTIONS: Therapeutic exercises, Therapeutic activity, Neuromuscular re-education, Balance training, Gait training, Patient/Family education, Self Care, Joint mobilization, Dry Needling, Electrical stimulation, Cryotherapy, Moist heat, Taping, and Manual therapy  PLAN FOR NEXT SESSION: Progress LE strengthening, mobility, and flexibility activities emphasizing on preventing  excessive knee valgus on functional activities.  2:29 PM, 06/25/23 Brian Zeitlin Small Barbar Brede MPT [place] [facility] [place] [phone]

## 2023-06-29 ENCOUNTER — Ambulatory Visit (HOSPITAL_COMMUNITY): Payer: Medicare Other

## 2023-06-29 DIAGNOSIS — M25561 Pain in right knee: Secondary | ICD-10-CM | POA: Diagnosis not present

## 2023-06-29 DIAGNOSIS — G8929 Other chronic pain: Secondary | ICD-10-CM

## 2023-06-29 NOTE — Therapy (Addendum)
OUTPATIENT PHYSICAL THERAPY TREATMENT  Patient Name: Monique [name] MRN: [id number] DOB:1956/04/24, 67 y.o., female Today's Date: 06/29/2023  END OF SESSION:  PT End of Session - 06/29/23 0815     Visit Number 6    Number of Visits 8    Date for PT Re-Evaluation 07/07/23    Authorization Type Medicare (A&B) and AARP    PT Start Time 0810    PT Stop Time 0845    PT Time Calculation (min) 35 min    Activity Tolerance Patient tolerated treatment well    Behavior During Therapy [facility] for tasks assessed/performed            Past Medical History:  Diagnosis Date   Colon cancer screening 03/22/2023   Essential hypertension 02/13/2016   Hyperlipidemia    Hypothyroidism    Long-term current use of benzodiazepine 04/05/2023   Unspecified hypothyroidism 03/02/2013   Past Surgical History:  Procedure Laterality Date   ENDOMETRIAL ABLATION     ROTATOR CUFF REPAIR  20 years ago   WISDOM TOOTH EXTRACTION  09/22/1971   Patient Active Problem List   Diagnosis Date Noted   PTSD (post-traumatic stress disorder) 04/05/2023   Generalized anxiety disorder 04/05/2023   Major depressive disorder, single episode, moderate (HCC) 04/05/2023   Colon cancer screening 03/22/2023   Coronary artery calcification seen on CT scan 10/19/2019   Abnormal screening cardiac CT 11/30/2018   Severe obesity (BMI 35.0-39.9) with comorbidity (HCC) 08/24/2018   Essential hypertension 02/13/2016   Mixed hyperlipidemia 02/13/2016   Primary insomnia 05/25/2014   Hypothyroidism 03/02/2013   Pure hypercholesterolemia 03/02/2013    PCP: Julien Girt, PA-C  REFERRING PROVIDER: Oliver Barre, MD  REFERRING DIAG: M17.11 (ICD-10-CM) - Arthritis of right knee  THERAPY DIAG:  Chronic pain of right knee  Rationale for Evaluation and Treatment: Rehabilitation  ONSET DATE: March 2024  SUBJECTIVE:   SUBJECTIVE STATEMENT: Knee is not doing great today because it's early in th morning. Patient  states that she was sore last time from the back leg swings. Current knee pain = 2.5/10. Patient states that she can bend the knee and do more. Around 10 minutes late today  Evaluation: Arrives to the clinic with c/o R knee pain on occasion (see below). However, patient denies pain at the time of evaluation. Patient states that R quads and R calf feel weak. Condition started March 2024 and gradually gotten worse in time without known cause. Denies any trauma on the knee. Patient took Meloxicam but it did not help. Patient went to see her MD in April 2024 who drained her knee and gave her injections. Procedure helped briefly (2-3 days) but the pain came back. Recently, patient went to see another MD who did X-ray and revealed arthritis. MD gave injections which significantly helped and referred her to outpatient PT evaluation and management.  PERTINENT HISTORY: Anxiety, PTSD PAIN:  Are you having pain? Yes: NPRS scale: 2/10 Pain location: lateral side of R knee, across the joint line Pain description: sharp and aching Aggravating factors: riding horse, negotiating stairs (descending), prolonged sitting Relieving factors: laying down  PRECAUTIONS: None  RED FLAGS: Bowel or bladder incontinence: No   WEIGHT BEARING RESTRICTIONS: No  FALLS:  Has patient fallen in last 6 months? 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  No acute injuries are noted.  Mild valgus alignment overall.  There are advanced degenerative changes within all  3 compartments.  There are associated osteophytes.  There appears to be some subchondral cysts within the medial aspect of the knee.  No bony lesions.   Impression: Right knee x-rays with moderate to severe degenerative changes  PATIENT SURVEYS:  FOTO 46.31  COGNITION: Overall cognitive status: Within functional limits for tasks assessed     SENSATION: Not tested  MUSCLE LENGTH: Hamstrings: moderate restriction on B Gastrocnemius: moderate restriction on B Piriformis: WFL  POSTURE:  Standing: B  genu valgus (R>L), slight pronation R foot  PALPATION: No tenderness on major bony landmarks of the R knee Hypomobile on R patella towards inferior and medial glide  LOWER EXTREMITY ROM:  Active ROM Right eval Left eval  Hip flexion [facility]  Hip extension [facility]  Hip abduction [facility]  Hip adduction    Hip internal rotation    Hip external rotation    Knee flexion 85 WFL  Knee extension [facility] WFL  Ankle dorsiflexion [facility] WFL  Ankle plantarflexion [facility] WFL  Ankle inversion    Ankle eversion     (Blank rows = not tested)  LOWER EXTREMITY MMT:  MMT Right eval Left eval  Hip flexion 3+ 4-  Hip extension 3+ 4-  Hip abduction 3+ 4-  Hip adduction    Hip internal rotation    Hip external rotation    Knee flexion 4+ 4+  Knee extension 3+ 4  Ankle dorsiflexion 4+ 4+  Ankle plantarflexion 4+ 4+  Ankle inversion    Ankle eversion     (Blank rows = not tested)  LOWER EXTREMITY SPECIAL TESTS:  Hip special tests: Ely's test: positive  and Piriformis test: negative Knee special tests: (+) Clark's test and Waldron test on R  FUNCTIONAL TESTS:  5 times sit to stand: 9.50 sec 2 minute walk test: 491 ft  GAIT: Distance walked: 491 ft Assistive device utilized: None Level of assistance: Complete Independence Comments: done during , no gait deviations/unsteadiness seen  STAIRS: alternating pattern on ascending, step-to pattern on descending, significant R out-toeing on R  and knee valgus on R more prominent in descending, c/o more discomfort on descending.   TODAY'S TREATMENT:                                                                                                                              DATE:  06/29/23 NuStep, level 3, seat 7, [place], 5 ' Standing R quads stretch with a strap/towel x 30" x 3 Gastrocnemius slant board stretch x 30" x 3 Seated R hamstring stretch x 30" x 3  Supine R patellar mobs on all planes, 2' total Bridging with hip abd, GTB x 3" x 10 x 2 Mini squats with hip abd, GTB x 3" x 10 x 2 Mini squats with hip adductor squeeze x 3" x 10 R lunges with lateral pull from GTB x 3" x 10 x 2  06/25/23 Nustep level 2, seat 7 [place] 5 minutes dynamic warm up  Seated Hamstring stretch with strap 5 x 20" Heel raises on incline 2 x 10 Slant board 5 x 20" 2# hip abduction 2 x 10 each 2# hip extension 2 x 10 each    06/23/23 Supine: Bridging 3"hold x 10 x 2  SLR 2X10 each  Hamstring stretch with dog leash 3X30"  Seated:  sit to stands  LAQ 2X10X5" holds  Marching alternating 10X2 Standing: hip abduction 2X10 each Nustep level 5, seat 7 [place] 5 minutes  06/18/23 NuStep level 5, seat 7, 5' Standing R quads stretch with a strap/towel x 30" x 3 Seated R calf stretch with a strap x 30" x 3 Seated R hamstring stretch x 30" x 3 Supine R patellar mobs on all planes, 2' total R SLR, with slight ER x 10 x 2 x 1 lb Bridging with hip abd, RTB x 3" x 10 x 2 Mini squats with hip abd, RTB x 3" x 10 x 2 R lunges with medial pull from RTB x 3" x 10 x 2  06/14/23 Goal review Exercises - Supine Bridge  - 2 x daily - 7 x weekly - 1 sets - 10 reps - Supine Active Straight Leg Raise  - 2 x daily - 7 x weekly - 1 sets - 10 reps - Sidelying Hip Abduction  - 2 x daily - 7 x weekly - 1 sets - 10 reps - Seated Long Arc Quad  - 2 x daily - 7 x weekly - 1 sets - 10 reps - 5 sec hold - Seated March  - 2 x daily - 7 x weekly - 1 sets - 10 reps -  Standing Hip Abduction with Counter Support  - 2 x daily - 7 x weekly - 1 sets - 10 reps - Standing Hip Extension with Counter Support  - 2 x daily - 7 x weekly - 1 sets - 10 reps - Sit to Stand  - 2 x daily - 7 x weekly - 1 sets - 10 reps Nustep level 5 seat 7 5 minutes  06/09/23 Evaluation and patient education done    PATIENT EDUCATION:  Education details: Educated on the pathoanatomy of R knee pain and arthritis. Educated on the goals and course of rehab.  Person educated: Patient Education method: Explanation Education comprehension: verbalized understanding  HOME EXERCISE PROGRAM: None provided at evaluation  Access Code: [email or web address] URL: [email or web address] 06/18/2023 - Seated Calf Stretch with Strap  - 1-2 x daily - 7 x weekly - 3 reps - 30 hold - Standing Quad Stretch with Towel and Arm Support  - 1-2 x daily - 7 x weekly - 3 reps - 30 hold - Seated Hamstring Stretch  - 1-2 x daily - 7 x weekly - 3 reps - 30 hold - Bridge with Hip Abduction and Resistance  - 1-2 x daily - 7 x weekly - 2 sets - 10 reps - 3 hold - Clamshell with Resistance  - 1-2 x daily - 7 x weekly - 2 sets - 10 reps - Mini Squat with Counter Support  - 1-2 x  daily - 7 x weekly - 2 sets - 10 reps - 3 hold   Date: 06/14/2023 Prepared by: Emeline Gins Exercises - Supine Bridge  - 2 x daily - 7 x weekly - 1 sets - 10 reps - Supine Active Straight Leg Raise  - 2 x daily - 7 x weekly - 1 sets - 10 reps - Sidelying Hip Abduction  - 2 x daily - 7 x weekly - 1 sets - 10 reps - Seated Long Arc Quad  - 2 x daily - 7 x weekly - 1 sets - 10 reps - 5 sec hold - Seated March  - 2 x daily - 7 x weekly - 1 sets - 10 reps - Standing Hip Abduction with Counter Support  - 2 x daily - 7 x weekly - 1 sets - 10 reps - Standing Hip Extension with Counter Support  - 2 x daily - 7 x weekly - 1 sets - 10 reps - Sit to Stand  - 2 x daily - 7 x weekly - 1 sets - 10 reps   ASSESSMENT:  CLINICAL  IMPRESSION: Interventions today were geared towards LE strengthening, and mobility. Tolerated all activities without worsening of symptoms except with the mini squats with hip abd where patient reported of an increase in pain to 3/10. Pain decreases with mini squats with hip adduction as well as after doing calf and quads stretch. Also, pain decreases when doing mini squats with the R LE in ER. With this, patient was instructed to remove the band when doing mini squats to her HEP. Did not report of worsening pain on lunges with lat pull from a theraband. Patient demonstrated excellent verbal understanding. Demonstrated appropriate levels of fatigue. Provided slight amount of cueing to ensure correct execution of activity with good carry-over. To date, skilled PT is required to address the impairments and improve function.    OBJECTIVE IMPAIRMENTS: decreased ROM, decreased strength, hypomobility, impaired flexibility, and pain.   ACTIVITY LIMITATIONS: carrying, lifting, bending, sitting, standing, squatting, and stairs  PARTICIPATION LIMITATIONS: meal prep, cleaning, laundry, driving, community activity, occupation, and yard work  PERSONAL FACTORS: Time since onset of injury/illness/exacerbation are also affecting patient's functional outcome.   REHAB POTENTIAL: Good  CLINICAL DECISION MAKING: Stable/uncomplicated  EVALUATION COMPLEXITY: Low   GOALS: Goals reviewed with patient? Yes  SHORT TERM GOALS: Target date: 06/23/23 Pt will demonstrate indep in HEP to facilitate carry-over of skilled services and improve functional outcomes    Goal Status:   IN PROGRESS  LONG TERM GOALS: Target date: 07/07/23  Pt will increase FOTO to at least 60 in order to demonstrate significant improvement in function related to stair negotiation, ambulation, and ADLs Baseline: 46.31 Goal status:  IN PROGRESS  2.  Pt will demonstrate increase in LE strength to 4+ to facilitate ease and safety in  ambulation Baseline: 3+/5 Goal status:  IN PROGRESS  3.  Patient will demonstrate increase in knee flex ROM to 110-120 degrees to facilitate ease in ambulation and stair negotiation Baseline: 85 degrees Goal status:  IN PROGRESS  4.  Pt will demonstrate improved flexibility in the LEs to mild restriction to facilitate ease in ambulation and ADLs Baseline: moderate restriction Goal status:  IN PROGRESS  5.  Pt will increase by at least 40 ft in order to demonstrate clinically significant improvement in community ambulation Baseline: 491 ft Goal status:  IN PROGRESS    PLAN:  PT FREQUENCY: 2x/week  PT DURATION: 4 weeks  PLANNED INTERVENTIONS: Therapeutic exercises, Therapeutic activity, Neuromuscular re-education, Balance training, Gait training, Patient/Family education, Self Care, Joint mobilization, Dry Needling, Electrical stimulation, Cryotherapy, Moist heat, Taping, and Manual therapy  PLAN FOR NEXT SESSION: Continue POC and may progress as tolerated with emphasis on LE strengthening, mobility, and flexibility activities emphasizing on preventing excessive knee valgus on functional activities.  8:17 AM, 06/29/23 Tish Frederickson. Xzavien Harada, PT, DPT, OCS Board-Certified Clinical Specialist in Orthopedic PT PT Compact Privilege # ([place]): [phone]

## 2023-07-02 ENCOUNTER — Ambulatory Visit (HOSPITAL_COMMUNITY): Payer: Medicare Other

## 2023-07-02 DIAGNOSIS — M25561 Pain in right knee: Secondary | ICD-10-CM | POA: Diagnosis not present

## 2023-07-02 DIAGNOSIS — G8929 Other chronic pain: Secondary | ICD-10-CM

## 2023-07-02 NOTE — Therapy (Addendum)
OUTPATIENT PHYSICAL THERAPY TREATMENT  Patient Name: Monique [name] MRN: [id number] DOB:October 12, 1955, 67 y.o., female Today's Date: 07/02/2023  END OF SESSION:   07/02/23 0935  PT Visits / Re-Eval  Visit Number 7  Number of Visits 8  Date for PT Re-Evaluation 07/07/23  Authorization  Authorization Type Medicare (A&B) and AARP  PT Time Calculation  PT Start Time 0930  PT Stop Time 1010  PT Time Calculation (min) 40 min  PT - End of Session  Activity Tolerance Patient tolerated treatment well  Behavior During Therapy [facility] for tasks assessed/performed      Past Medical History:  Diagnosis Date   Colon cancer screening 03/22/2023   Essential hypertension 02/13/2016   Hyperlipidemia    Hypothyroidism    Long-term current use of benzodiazepine 04/05/2023   Unspecified hypothyroidism 03/02/2013   Past Surgical History:  Procedure Laterality Date   ENDOMETRIAL ABLATION     ROTATOR CUFF REPAIR  20 years ago   WISDOM TOOTH EXTRACTION  09/22/1971   Patient Active Problem List   Diagnosis Date Noted   PTSD (post-traumatic stress disorder) 04/05/2023   Generalized anxiety disorder 04/05/2023   Major depressive disorder, single episode, moderate (HCC) 04/05/2023   Colon cancer screening 03/22/2023   Coronary artery calcification seen on CT scan 10/19/2019   Abnormal screening cardiac CT 11/30/2018   Severe obesity (BMI 35.0-39.9) with comorbidity (HCC) 08/24/2018   Essential hypertension 02/13/2016   Mixed hyperlipidemia 02/13/2016   Primary insomnia 05/25/2014   Hypothyroidism 03/02/2013   Pure hypercholesterolemia 03/02/2013    PCP: Julien Girt, PA-C  REFERRING PROVIDER: Oliver Barre, MD  REFERRING DIAG: M17.11 (ICD-10-CM) - Arthritis of right knee  THERAPY DIAG:  No diagnosis found.  Rationale for Evaluation and Treatment: Rehabilitation  ONSET DATE: March 2024  SUBJECTIVE:   SUBJECTIVE STATEMENT: Doing well today and denies any pain.  Patient is now able to tolerate riding a horse without any pain. However, patient reports that her knee feels weak when doing stair negotiation  Evaluation: Arrives to the clinic with c/o R knee pain on occasion (see below). However, patient denies pain at the time of evaluation. Patient states that R quads and R calf feel weak. Condition started March 2024 and gradually gotten worse in time without known cause. Denies any trauma on the knee. Patient took Meloxicam but it did not help. Patient went to see her MD in April 2024 who drained her knee and gave her injections. Procedure helped briefly (2-3 days) but the pain came back. Recently, patient went to see another MD who did X-ray and revealed arthritis. MD gave injections which significantly helped and referred her to outpatient PT evaluation and management.  PERTINENT HISTORY: Anxiety, PTSD PAIN:  Are you having pain? Yes: NPRS scale: 2/10 Pain location: lateral side of R knee, across the joint line Pain description: sharp and aching Aggravating factors: riding horse, negotiating stairs (descending), prolonged sitting Relieving factors: laying down  PRECAUTIONS: None  RED FLAGS: Bowel or bladder incontinence: No   WEIGHT BEARING RESTRICTIONS: No  FALLS:  Has patient fallen in last 6 months? 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  No acute injuries are noted.  Mild valgus alignment overall.  There are advanced degenerative changes within all 3 compartments.  There are  associated osteophytes.  There appears to be some subchondral cysts within the medial aspect of the knee.  No bony lesions.   Impression: Right knee x-rays with moderate to severe degenerative changes  PATIENT SURVEYS:  FOTO 46.31  COGNITION: Overall cognitive status: Within functional limits for tasks assessed     SENSATION: Not tested  MUSCLE LENGTH: Hamstrings: moderate restriction on B Gastrocnemius: moderate restriction on B Piriformis: WFL  POSTURE:  Standing: B  genu valgus (R>L), slight pronation R foot  PALPATION: No tenderness on major bony landmarks of the R knee Hypomobile on R patella towards inferior and medial glide  LOWER EXTREMITY ROM:  Active ROM Right eval Left eval  Hip flexion [facility]  Hip extension [facility]  Hip abduction [facility]  Hip adduction    Hip internal rotation    Hip external rotation    Knee flexion 85 WFL  Knee extension [facility] WFL  Ankle dorsiflexion [facility] WFL  Ankle plantarflexion [facility] WFL  Ankle inversion    Ankle eversion     (Blank rows = not tested)  LOWER EXTREMITY MMT:  MMT Right eval Left eval  Hip flexion 3+ 4-  Hip extension 3+ 4-  Hip abduction 3+ 4-  Hip adduction    Hip internal rotation    Hip external rotation    Knee flexion 4+ 4+  Knee extension 3+ 4  Ankle dorsiflexion 4+ 4+  Ankle plantarflexion 4+ 4+  Ankle inversion    Ankle eversion     (Blank rows = not tested)  LOWER EXTREMITY SPECIAL TESTS:  Hip special tests: Ely's test: positive  and Piriformis test: negative Knee special tests: (+) Clark's test and Waldron test on R  FUNCTIONAL TESTS:  5 times sit to stand: 9.50 sec 2 minute walk test: 491 ft  GAIT: Distance walked: 491 ft Assistive device utilized: None Level of assistance: Complete Independence Comments: done during , no gait deviations/unsteadiness seen  STAIRS: alternating pattern on ascending, step-to pattern on descending, significant R out-toeing on R and knee valgus on R more  prominent in descending, c/o more discomfort on descending.   TODAY'S TREATMENT:                                                                                                                              DATE:  07/02/23 Recumbent bike, level 3 x 5' Standing R quads stretch with a strap/towel x 30" x 3 Gastrocnemius slant board stretch x 30" x 3 Seated R hamstring stretch x 30" x 3 Seated R piriformis stretch x 30" x 3 Supine R patellar mobs on all planes, 2' total R Forward step ups x 6" box x 10 x 2 Step downs (  heel taps) on half a foam roll x 10 R side step ups x 6" x 10 Standing R ITB stretch x 30" x 2  06/29/23 NuStep, level 3, seat 7, [place], 5 ' Standing R quads stretch with a strap/towel x 30" x 3 Gastrocnemius slant board stretch x 30" x 3 Seated R hamstring stretch x 30" x 3 Supine R patellar mobs on all planes, 2' total Bridging with hip abd, GTB x 3" x 10 x 2 Mini squats with hip abd, GTB x 3" x 10 x 2 Mini squats with hip adductor squeeze x 3" x 10 R lunges with lateral pull from GTB x 3" x 10 x 2  06/25/23 Nustep level 2, seat 7 [place] 5 minutes dynamic warm up  Seated Hamstring stretch with strap 5 x 20" Heel raises on incline 2 x 10 Slant board 5 x 20" 2# hip abduction 2 x 10 each 2# hip extension 2 x 10 each    06/23/23 Supine: Bridging 3"hold x 10 x 2  SLR 2X10 each  Hamstring stretch with dog leash 3X30"  Seated:  sit to stands  LAQ 2X10X5" holds  Marching alternating 10X2 Standing: hip abduction 2X10 each Nustep level 5, seat 7 [place] 5 minutes  06/18/23 NuStep level 5, seat 7, 5' Standing R quads stretch with a strap/towel x 30" x 3 Seated R calf stretch with a strap x 30" x 3 Seated R hamstring stretch x 30" x 3 Supine R patellar mobs on all planes, 2' total R SLR, with slight ER x 10 x 2 x 1 lb Bridging with hip abd, RTB x 3" x 10 x 2 Mini squats with hip abd, RTB x 3" x 10 x 2 R lunges with medial pull from RTB x 3" x 10 x  2  06/14/23 Goal review Exercises - Supine Bridge  - 2 x daily - 7 x weekly - 1 sets - 10 reps - Supine Active Straight Leg Raise  - 2 x daily - 7 x weekly - 1 sets - 10 reps - Sidelying Hip Abduction  - 2 x daily - 7 x weekly - 1 sets - 10 reps - Seated Long Arc Quad  - 2 x daily - 7 x weekly - 1 sets - 10 reps - 5 sec hold - Seated March  - 2 x daily - 7 x weekly - 1 sets - 10 reps - Standing Hip Abduction with Counter Support  - 2 x daily - 7 x weekly - 1 sets - 10 reps - Standing Hip Extension with Counter Support  - 2 x daily - 7 x weekly - 1 sets - 10 reps - Sit to Stand  - 2 x daily - 7 x weekly - 1 sets - 10 reps Nustep level 5 seat 7 5 minutes  06/09/23 Evaluation and patient education done    PATIENT EDUCATION:  Education details: Educated on the pathoanatomy of R knee pain and arthritis. Educated on the goals and course of rehab.  Person educated: Patient Education method: Explanation Education comprehension: verbalized understanding  HOME EXERCISE PROGRAM: None provided at evaluation  Access Code: [email or web address] URL: [email or web address] 07/[email or web address]/23 - Seated Piriformis Stretch  - 1-2 x daily - 7 x weekly - 3 reps - 30 hold - ITB Stretch at Wall  - 1-2 x daily - 7 x weekly - 3 reps - 30 hold  06/18/2023 - Seated Calf Stretch with Strap  - 1-2 x daily - 7 x weekly -  3 reps - 30 hold - Standing Quad Stretch with Towel and Arm Support  - 1-2 x daily - 7 x weekly - 3 reps - 30 hold - Seated Hamstring Stretch  - 1-2 x daily - 7 x weekly - 3 reps - 30 hold - Bridge with Hip Abduction and Resistance  - 1-2 x daily - 7 x weekly - 2 sets - 10 reps - 3 hold - Clamshell with Resistance  - 1-2 x daily - 7 x weekly - 2 sets - 10 reps - Mini Squat with Counter Support  - 1-2 x daily - 7 x weekly - 2 sets - 10 reps - 3 hold   Date: 06/14/2023 Prepared by: Emeline Gins Exercises - Supine Bridge  - 2 x daily - 7 x weekly - 1 sets - 10 reps - Supine Active Straight Leg  Raise  - 2 x daily - 7 x weekly - 1 sets - 10 reps - Sidelying Hip Abduction  - 2 x daily - 7 x weekly - 1 sets - 10 reps - Seated Long Arc Quad  - 2 x daily - 7 x weekly - 1 sets - 10 reps - 5 sec hold - Seated March  - 2 x daily - 7 x weekly - 1 sets - 10 reps - Standing Hip Abduction with Counter Support  - 2 x daily - 7 x weekly - 1 sets - 10 reps - Standing Hip Extension with Counter Support  - 2 x daily - 7 x weekly - 1 sets - 10 reps - Sit to Stand  - 2 x daily - 7 x weekly - 1 sets - 10 reps   ASSESSMENT:  CLINICAL IMPRESSION: Interventions today were geared towards LE strengthening, and mobility. True leg length R = 90 cm, L = 80.5 cm. Tolerated all activities without worsening of symptoms. Reported of slight difficulty side step ups and step downs due to weakness. Patient demonstrated excellent verbal understanding. Demonstrated appropriate levels of fatigue. Provided slight amount of cueing to ensure correct execution of activity with good carry-over. To date, skilled PT is required to address the impairments and improve function.    OBJECTIVE IMPAIRMENTS: decreased ROM, decreased strength, hypomobility, impaired flexibility, and pain.   ACTIVITY LIMITATIONS: carrying, lifting, bending, sitting, standing, squatting, and stairs  PARTICIPATION LIMITATIONS: meal prep, cleaning, laundry, driving, community activity, occupation, and yard work  PERSONAL FACTORS: Time since onset of injury/illness/exacerbation are also affecting patient's functional outcome.   REHAB POTENTIAL: Good  CLINICAL DECISION MAKING: Stable/uncomplicated  EVALUATION COMPLEXITY: Low   GOALS: Goals reviewed with patient? Yes  SHORT TERM GOALS: Target date: 06/23/23 Pt will demonstrate indep in HEP to facilitate carry-over of skilled services and improve functional outcomes    Goal Status:   IN PROGRESS  LONG TERM GOALS: Target date: 07/07/23  Pt will increase FOTO to at least 60 in order to  demonstrate significant improvement in function related to stair negotiation, ambulation, and ADLs Baseline: 46.31 Goal status:  IN PROGRESS  2.  Pt will demonstrate increase in LE strength to 4+ to facilitate ease and safety in ambulation Baseline: 3+/5 Goal status:  IN PROGRESS  3.  Patient will demonstrate increase in knee flex ROM to 110-120 degrees to facilitate ease in ambulation and stair negotiation Baseline: 85 degrees Goal status:  IN PROGRESS  4.  Pt will demonstrate improved flexibility in the LEs to mild restriction to facilitate ease in ambulation and ADLs Baseline: moderate restriction Goal  status:  IN PROGRESS  5.  Pt will increase by at least 40 ft in order to demonstrate clinically significant improvement in community ambulation Baseline: 491 ft Goal status:  IN PROGRESS    PLAN:  PT FREQUENCY: 2x/week  PT DURATION: 4 weeks  PLANNED INTERVENTIONS: Therapeutic exercises, Therapeutic activity, Neuromuscular re-education, Balance training, Gait training, Patient/Family education, Self Care, Joint mobilization, Dry Needling, Electrical stimulation, Cryotherapy, Moist heat, Taping, and Manual therapy  PLAN FOR NEXT SESSION: Continue POC and may progress as tolerated with emphasis on LE strengthening, mobility, and flexibility activities emphasizing on preventing excessive knee valgus on functional activities. Re-assess next  visit.  9:29 AM, 07/02/23 Tish Frederickson. Hermelinda Diegel, PT, DPT, OCS Board-Certified Clinical Specialist in Orthopedic PT PT Compact Privilege # ([place]): [phone] T

## 2023-07-06 ENCOUNTER — Ambulatory Visit (HOSPITAL_COMMUNITY): Payer: Medicare Other

## 2023-07-06 DIAGNOSIS — G8929 Other chronic pain: Secondary | ICD-10-CM

## 2023-07-06 DIAGNOSIS — M25561 Pain in right knee: Secondary | ICD-10-CM | POA: Diagnosis not present

## 2023-07-06 NOTE — Addendum Note (Signed)
Addended by: Trudee Grip on: 07/06/2023 09:35 AM   Modules accepted: Orders

## 2023-07-06 NOTE — Addendum Note (Signed)
Addended by: Trudee Grip on: 07/06/2023 09:05 AM   Modules accepted: Orders

## 2023-07-06 NOTE — Therapy (Addendum)
OUTPATIENT PHYSICAL THERAPY PROGRESS NOTE  Patient Name: Monique [name] MRN: [id number] DOB:1956-08-28, 67 y.o., female Today's Date: 07/06/2023  END OF SESSION:   PT End of Session - 07/06/23 0827     Visit Number 8    Number of Visits 16    Date for PT Re-Evaluation 08/03/23    Authorization Type Medicare (A&B) and AARP    PT Start Time 0800    PT Stop Time 0840    PT Time Calculation (min) 40 min    Activity Tolerance Patient tolerated treatment well    Behavior During Therapy [facility] for tasks assessed/performed             Past Medical History:  Diagnosis Date   Colon cancer screening 03/22/2023   Essential hypertension 02/13/2016   Hyperlipidemia    Hypothyroidism    Long-term current use of benzodiazepine 04/05/2023   Unspecified hypothyroidism 03/02/2013   Past Surgical History:  Procedure Laterality Date   ENDOMETRIAL ABLATION     ROTATOR CUFF REPAIR  20 years ago   WISDOM TOOTH EXTRACTION  09/22/1971   Patient Active Problem List   Diagnosis Date Noted   PTSD (post-traumatic stress disorder) 04/05/2023   Generalized anxiety disorder 04/05/2023   Major depressive disorder, single episode, moderate (HCC) 04/05/2023   Colon cancer screening 03/22/2023   Coronary artery calcification seen on CT scan 10/19/2019   Abnormal screening cardiac CT 11/30/2018   Severe obesity (BMI 35.0-39.9) with comorbidity (HCC) 08/24/2018   Essential hypertension 02/13/2016   Mixed hyperlipidemia 02/13/2016   Primary insomnia 05/25/2014   Hypothyroidism 03/02/2013   Pure hypercholesterolemia 03/02/2013   Progress Note Reporting Period 06/09/23 to 07/06/23  See note below for Objective Data and Assessment of Progress/Goals.     PCP: Julien Girt, PA-C  REFERRING PROVIDER: Oliver Barre, MD  REFERRING DIAG: M17.11 (ICD-10-CM) - Arthritis of right knee  THERAPY DIAG:  Chronic pain of right knee  Rationale for Evaluation and Treatment:  Rehabilitation  ONSET DATE: March 2024  SUBJECTIVE:   SUBJECTIVE STATEMENT: PROGRESS NOTE: 07/06/23: Patient denies having pain but there was one night that she had a rough night around 3 days ago. Knee feels looser now. Patient thinks that PT has helped a lot such that she is around 80% better now. Patient thinks that she needs to continue with PT on a lesser frequency it's because she still feels that the knee still feels weak especially with stairs. Patient states that she's been doing her HEP at home.   Evaluation: Arrives to the clinic with c/o R knee pain on occasion (see below). However, patient denies pain at the time of evaluation. Patient states that R quads and R calf feel weak. Condition started March 2024 and gradually gotten worse in time without known cause. Denies any trauma on the knee. Patient took Meloxicam but it did not help. Patient went to see her MD in April 2024 who drained her knee and gave her injections. Procedure helped briefly (2-3 days) but the pain came back. Recently, patient went to see another MD who did X-ray and revealed arthritis. MD gave injections which significantly helped and referred her to outpatient PT evaluation and management.  PERTINENT HISTORY: Anxiety, PTSD PAIN:  Are you having pain? Yes: NPRS scale: 2/10 Pain location: lateral side of R knee, across the joint line Pain description: sharp and aching Aggravating factors: riding horse, negotiating stairs (descending), prolonged sitting Relieving factors: laying down  PRECAUTIONS: None  RED FLAGS: Bowel or bladder  incontinence: No   WEIGHT BEARING RESTRICTIONS: No  FALLS:  Has patient fallen in last 6 months? 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  No acute injuries are noted.  Mild valgus alignment overall.  There are advanced degenerative changes within all 3 compartments.  There are associated osteophytes.  There appears to be some subchondral cysts within the medial aspect of the knee.  No bony lesions.   Impression: Right knee x-rays with moderate to severe degenerative changes  PATIENT SURVEYS:  FOTO 07/06/23: 57.36 from 46.31  COGNITION: Overall cognitive status: Within functional limits for tasks assessed     SENSATION: Not tested  MUSCLE LENGTH: 07/06/23 Hamstrings: moderate restriction on B Gastrocnemius: moderate restriction on B Piriformis: WFL  POSTURE:  Standing: B  genu valgus (R>L), slight pronation R foot  PALPATION: 07/06/23 No tenderness on major bony landmarks of the R knee Hypomobile on R patella towards inferior and medial glide  LOWER EXTREMITY ROM:  Active ROM Right eval Left eval Right 07/06/23  Hip flexion [facility] WFL  Hip extension [facility] WFL  Hip abduction [facility] WFL  Hip adduction     Hip internal rotation     Hip external rotation     Knee flexion 85 WFL 120  Knee extension [facility] WFL  Ankle dorsiflexion [facility] WFL  Ankle plantarflexion [facility] WFL  Ankle inversion     Ankle eversion      (Blank rows = not tested)  LOWER EXTREMITY MMT:  MMT Right eval Left eval Right 07/06/23 Left 07/06/23  Hip flexion 3+ 4- 4 4  Hip extension 3+ 4- 4 4  Hip abduction 3+ 4- 4 4  Hip adduction      Hip internal rotation      Hip external rotation      Knee flexion 4+ 4+ 4+ 4+  Knee extension 3+ 4 4 4   Ankle dorsiflexion 4+ 4+ 4+ 4+  Ankle plantarflexion 4+ 4+ 4+ 4+  Ankle inversion       Ankle eversion       (Blank rows = not tested)  LOWER EXTREMITY SPECIAL TESTS:  Hip special tests: Ely's test: positive  and Piriformis test: negative Knee special tests: (+) Clark's test and Waldron test on R  FUNCTIONAL TESTS:  5 times sit to stand: 07/06/23: 6.71 sec from 9.50 sec 2 minute walk test: 07/06/23: 576 ft from 491 ft  GAIT: Distance walked: 491 ft Assistive device utilized: None Level of assistance: Complete Independence Comments: done during , no gait deviations/unsteadiness seen  STAIRS: 07/06/23: alternating pattern on ascending/descending, slight R out-toeing on R and knee valgus on R more prominent in descending   TODAY'S TREATMENT:  DATE:  07/06/23 Progress note (FOTO, , 5TSTS, ROM, MMT) Recumbent bike, level 4, seat 12,  x 5' Standing R quads stretch with a strap/towel x 30" x 3 Gastrocnemius slant board stretch x 30" x 3 Seated R hamstring stretch x 30" x 3 R Forward step ups x 6" box x 10 x 2 Step downs (heel taps) on half a foam roll x 10 x 2  07/02/23 Recumbent bike, level 3 x 5' Standing R quads stretch with a strap/towel x 30" x 3 Gastrocnemius slant board stretch x 30" x 3 Seated R hamstring stretch x 30" x 3 Seated R piriformis stretch x 30" x 3 Supine R patellar mobs on all planes, 2' total R Forward step ups x 6" box x 10 x 2 Step downs (heel taps) on half a foam roll x 10 R side step ups x 6" x 10 Standing R ITB stretch x 30" x 2  06/29/23 NuStep, level 3, seat 7, [place], 5 ' Standing R quads stretch with a strap/towel x 30" x 3 Gastrocnemius slant board stretch x 30" x 3 Seated R hamstring stretch x 30" x 3 Supine R patellar mobs on all planes, 2' total Bridging with hip abd, GTB x 3" x 10 x 2 Mini squats with hip abd, GTB x 3" x 10 x 2 Mini squats with hip adductor squeeze x 3" x 10 R lunges with  lateral pull from GTB x 3" x 10 x 2  06/25/23 Nustep level 2, seat 7 [place] 5 minutes dynamic warm up  Seated Hamstring stretch with strap 5 x 20" Heel raises on incline 2 x 10 Slant board 5 x 20" 2# hip abduction 2 x 10 each 2# hip extension 2 x 10 each    06/23/23 Supine: Bridging 3"hold x 10 x 2  SLR 2X10 each  Hamstring stretch with dog leash 3X30"  Seated:  sit to stands  LAQ 2X10X5" holds  Marching alternating 10X2 Standing: hip abduction 2X10 each Nustep level 5, seat 7 [place] 5 minutes  06/18/23 NuStep level 5, seat 7, 5' Standing R quads stretch with a strap/towel x 30" x 3 Seated R calf stretch with a strap x 30" x 3 Seated R hamstring stretch x 30" x 3 Supine R patellar mobs on all planes, 2' total R SLR, with slight ER x 10 x 2 x 1 lb Bridging with hip abd, RTB x 3" x 10 x 2 Mini squats with hip abd, RTB x 3" x 10 x 2 R lunges with medial pull from RTB x 3" x 10 x 2  06/14/23 Goal review Exercises - Supine Bridge  - 2 x daily - 7 x weekly - 1 sets - 10 reps - Supine Active Straight Leg Raise  - 2 x daily - 7 x weekly - 1 sets - 10 reps - Sidelying Hip Abduction  - 2 x daily - 7 x weekly - 1 sets - 10 reps - Seated Long Arc Quad  - 2 x daily - 7 x weekly - 1 sets - 10 reps - 5 sec hold - Seated March  - 2 x daily - 7 x weekly - 1 sets - 10 reps - Standing Hip Abduction with Counter Support  - 2 x daily - 7 x weekly - 1 sets - 10 reps - Standing Hip Extension with Counter Support  - 2 x daily - 7 x weekly - 1 sets - 10 reps - Sit to Stand  -  2 x daily - 7 x weekly - 1 sets - 10 reps Nustep level 5 seat 7 5 minutes  06/09/23 Evaluation and patient education done    PATIENT EDUCATION:  Education details: Educated on the pathoanatomy of R knee pain and arthritis. Educated on the goals and course of rehab.  Person educated: Patient Education method: Explanation Education comprehension: verbalized understanding  HOME EXERCISE PROGRAM: None provided at  evaluation  Access Code: [email or web address] URL: [email or web address] 07/02/23 - Seated Piriformis Stretch  - 1-2 x daily - 7 x weekly - 3 reps - 30 hold - ITB Stretch at Wall  - 1-2 x daily - 7 x weekly - 3 reps - 30 hold  06/18/2023 - Seated Calf Stretch with Strap  - 1-2 x daily - 7 x weekly - 3 reps - 30 hold - Standing Quad Stretch with Towel and Arm Support  - 1-2 x daily - 7 x weekly - 3 reps - 30 hold - Seated Hamstring Stretch  - 1-2 x daily - 7 x weekly - 3 reps - 30 hold - Bridge with Hip Abduction and Resistance  - 1-2 x daily - 7 x weekly - 2 sets - 10 reps - 3 hold - Clamshell with Resistance  - 1-2 x daily - 7 x weekly - 2 sets - 10 reps - Mini Squat with Counter Support  - 1-2 x daily - 7 x weekly - 2 sets - 10 reps - 3 hold   Date: 06/14/2023 Prepared by: Emeline Gins Exercises - Supine Bridge  - 2 x daily - 7 x weekly - 1 sets - 10 reps - Supine Active Straight Leg Raise  - 2 x daily - 7 x weekly - 1 sets - 10 reps - Sidelying Hip Abduction  - 2 x daily - 7 x weekly - 1 sets - 10 reps - Seated Long Arc Quad  - 2 x daily - 7 x weekly - 1 sets - 10 reps - 5 sec hold - Seated March  - 2 x daily - 7 x weekly - 1 sets - 10 reps - Standing Hip Abduction with Counter Support  - 2 x daily - 7 x weekly - 1 sets - 10 reps - Standing Hip Extension with Counter Support  - 2 x daily - 7 x weekly - 1 sets - 10 reps - Sit to Stand  - 2 x daily - 7 x weekly - 1 sets - 10 reps   ASSESSMENT:  CLINICAL IMPRESSION: PROGRESS NOTE 07/06/23: Patient demonstrated continued improvements in function as indicated by positive significant changes in FOTO, 5TSTS, and . Patient does not report of pain anymore. However, patient still presents with deficits in functional strength. With this, skilled PT is still required to address the impairments and functional limitations listed below. Interventions today were geared towards LE strengthening, and mobility. Tolerated all activities without  worsening of symptoms. Reported of slight difficulty with step ups and and step downs due to weakness. Patient demonstrated excellent verbal understanding. Demonstrated appropriate levels of fatigue. Provided slight amount of cueing to ensure correct execution of activity with good carry-over.   OBJECTIVE IMPAIRMENTS: decreased ROM, decreased strength, hypomobility, impaired flexibility, and pain.   ACTIVITY LIMITATIONS: carrying, lifting, bending, sitting, standing, squatting, and stairs  PARTICIPATION LIMITATIONS: meal prep, cleaning, laundry, driving, community activity, occupation, and yard work  PERSONAL FACTORS: Time since onset of injury/illness/exacerbation are also affecting patient's functional outcome.   REHAB POTENTIAL: Good  CLINICAL DECISION MAKING:  Stable/uncomplicated  EVALUATION COMPLEXITY: Low   GOALS: Goals reviewed with patient? Yes  SHORT TERM GOALS: Target date: 06/23/23 Pt will demonstrate indep in HEP to facilitate carry-over of skilled services and improve functional outcomes    Goal Status:   MET  LONG TERM GOALS: Target date: 08/03/23  Pt will increase FOTO to at least 60 in order to demonstrate significant improvement in function related to stair negotiation, ambulation, and ADLs Baseline: 46.31 Goal status:  IN PROGRESS  2.  Pt will demonstrate increase in LE strength to 4+ to facilitate ease and safety in ambulation Baseline: 3+/5 Goal status:  IN PROGRESS  3.  Patient will demonstrate increase in knee flex ROM to 110-120 degrees to facilitate ease in ambulation and stair negotiation Baseline: 85 degrees Goal status:  MET  4.  Pt will demonstrate improved flexibility in the LEs to mild restriction to facilitate ease in ambulation and ADLs Baseline: moderate restriction Goal status:  IN PROGRESS  5.  Pt will increase by at least 40 ft in order to demonstrate clinically significant improvement in community ambulation Baseline: 491 ft Goal  status:  MET    PLAN:  PT FREQUENCY: 2x/week  PT DURATION: 4 weeks  PLANNED INTERVENTIONS: Therapeutic exercises, Therapeutic activity, Neuromuscular re-education, Balance training, Gait training, Patient/Family education, Self Care, Joint mobilization, Dry Needling, Electrical stimulation, Cryotherapy, Moist heat, Taping, and Manual therapy  PLAN FOR NEXT SESSION: Continue POC and may progress as tolerated with emphasis on LE strengthening, mobility, and flexibility activities emphasizing on preventing excessive knee valgus on functional activities.  Tish Frederickson. Kyriakos Babler, PT, DPT, OCS Board-Certified Clinical Specialist in Orthopedic PT PT Compact Privilege # ([place]): [phone] T  8:28 AM, 07/06/23

## 2023-07-08 ENCOUNTER — Ambulatory Visit (HOSPITAL_COMMUNITY): Payer: Medicare Other | Admitting: Physical Therapy

## 2023-07-08 DIAGNOSIS — G8929 Other chronic pain: Secondary | ICD-10-CM

## 2023-07-08 DIAGNOSIS — M25561 Pain in right knee: Secondary | ICD-10-CM | POA: Diagnosis not present

## 2023-07-08 NOTE — Therapy (Signed)
OUTPATIENT PHYSICAL THERAPY PROGRESS NOTE  Patient Name: Monique [name] MRN: [id number] DOB:[birth year], 67 y.o., female Today's Date: 07/08/2023  END OF SESSION:   PT End of Session - 07/08/23 0837     Visit Number 9    Number of Visits 16    Date for PT Re-Evaluation 08/03/23    Authorization Type Medicare (A&B) and AARP    PT Start Time 0802    PT Stop Time 0841    PT Time Calculation (min) 39 min    Activity Tolerance Patient tolerated treatment well    Behavior During Therapy [facility] for tasks assessed/performed              Past Medical History:  Diagnosis Date   Colon cancer screening 03/22/2023   Essential hypertension 02/13/2016   Hyperlipidemia    Hypothyroidism    Long-term current use of benzodiazepine 04/05/2023   Unspecified hypothyroidism 03/02/2013   Past Surgical History:  Procedure Laterality Date   ENDOMETRIAL ABLATION     ROTATOR CUFF REPAIR  20 years ago   WISDOM TOOTH EXTRACTION  09/22/1971   Patient Active Problem List   Diagnosis Date Noted   PTSD (post-traumatic stress disorder) 04/05/2023   Generalized anxiety disorder 04/05/2023   Major depressive disorder, single episode, moderate (HCC) 04/05/2023   Colon cancer screening 03/22/2023   Coronary artery calcification seen on CT scan 10/19/2019   Abnormal screening cardiac CT 11/30/2018   Severe obesity (BMI 35.0-39.9) with comorbidity (HCC) 08/24/2018   Essential hypertension 02/13/2016   Mixed hyperlipidemia 02/13/2016   Primary insomnia 05/25/2014   Hypothyroidism 03/02/2013   Pure hypercholesterolemia 03/02/2013   Progress Note Reporting Period 06/09/23 to 07/06/23  See note below for Objective Data and Assessment of Progress/Goals.     PCP: Julien Girt, PA-C  REFERRING PROVIDER: Oliver Barre, MD  REFERRING DIAG: M17.11 (ICD-10-CM) - Arthritis of right knee  THERAPY DIAG:  Chronic pain of right knee  Rationale for Evaluation and Treatment:  Rehabilitation  ONSET DATE: March 2024  SUBJECTIVE:   SUBJECTIVE STATEMENT: Pt states that she rode her horse for three hours yesterday, it is cold and early so her pain is increased.     Evaluation: Arrives to the clinic with c/o R knee pain on occasion (see below). However, patient denies pain at the time of evaluation. Patient states that R quads and R calf feel weak. Condition started March 2024 and gradually gotten worse in time without known cause. Denies any trauma on the knee. Patient took Meloxicam but it did not help. Patient went to see her MD in April 2024 who drained her knee and gave her injections. Procedure helped briefly (2-3 days) but the pain came back. Recently, patient went to see another MD who did X-ray and revealed arthritis. MD gave injections which significantly helped and referred her to outpatient PT evaluation and management.  PERTINENT HISTORY: Anxiety, PTSD PAIN:  Are you having pain? Yes: NPRS scale: 2/10 Pain location: lateral side of R knee, across the joint line Pain description: sharp and aching Aggravating factors: riding horse, negotiating stairs (descending), prolonged sitting Relieving factors: laying down  PRECAUTIONS: None  RED FLAGS: Bowel or bladder incontinence: No   WEIGHT BEARING RESTRICTIONS: No  FALLS:  Has patient fallen in last 6 months? 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  No acute injuries are noted.   Mild valgus alignment overall.  There are advanced degenerative changes within all 3 compartments.  There are associated osteophytes.  There appears to be some subchondral cysts within the medial aspect of the knee.  No bony lesions.   Impression: Right knee x-rays with moderate to severe degenerative changes  PATIENT SURVEYS:  FOTO 07/06/23: 57.36 from 46.31  COGNITION: Overall cognitive status: Within functional limits for tasks assessed     SENSATION: Not tested  MUSCLE LENGTH: 07/06/23 Hamstrings: moderate restriction on B Gastrocnemius: moderate restriction on B Piriformis: WFL  POSTURE:  Standing: B  genu valgus (R>L), slight pronation R foot  PALPATION: 07/06/23 No tenderness on major bony landmarks of the R knee Hypomobile on R patella towards inferior and medial glide  LOWER EXTREMITY ROM:  Active ROM Right eval Left eval Right 07/06/23  Hip flexion [facility] WFL  Hip extension [facility] WFL  Hip abduction [facility] WFL  Hip adduction     Hip internal rotation     Hip external rotation     Knee flexion 85 WFL 120  Knee extension [facility] WFL  Ankle dorsiflexion [facility] WFL  Ankle plantarflexion [facility] WFL  Ankle inversion     Ankle eversion      (Blank rows = not tested)  LOWER EXTREMITY MMT:  MMT Right eval Left eval Right 07/06/23 Left 07/06/23  Hip flexion 3+ 4- 4 4  Hip extension 3+ 4- 4 4  Hip abduction 3+ 4- 4 4  Hip adduction      Hip internal rotation      Hip external rotation      Knee flexion 4+ 4+ 4+ 4+  Knee extension 3+ 4 4 4   Ankle dorsiflexion 4+ 4+ 4+ 4+  Ankle plantarflexion 4+ 4+ 4+ 4+  Ankle inversion      Ankle eversion       (Blank rows = not tested)  LOWER EXTREMITY SPECIAL TESTS:  Hip special tests: Ely's test: positive  and Piriformis test: negative Knee special tests: (+) Clark's test and Waldron test on R  FUNCTIONAL TESTS:  5 times sit to stand: 07/06/23: 6.71 sec from 9.50 sec 2 minute walk test: 07/06/23: 576 ft  from 491 ft  GAIT: Distance walked: 491 ft Assistive device utilized: None Level of assistance: Complete Independence Comments: done during , no gait deviations/unsteadiness seen  STAIRS: 07/06/23: alternating pattern on ascending/descending, slight R out-toeing on R and knee valgus on R more prominent in descending   TODAY'S TREATMENT:  DATE:  07/08/23 Bike x 6 minutes Hamstring curl cybex  4PL x 10 Leg press 3 Pl x 10  Side step with green theraband x 2 RT Slant board stretch 3 x 30" Hamstring stretch 30" x 3  Single leg stance x 3 B  Mini squat x 15 Sit to stand x 10  6" step up x 10   07/06/23 Progress note (FOTO, , 5TSTS, ROM, MMT) Recumbent bike, level 4, seat 12,  x 5' Standing R quads stretch with a strap/towel x 30" x 3 Gastrocnemius slant board stretch x 30" x 3 Seated R hamstring stretch x 30" x 3 R Forward step ups x 6" box x 10 x 2 Step downs (heel taps) on half a foam roll x 10 x 2  07/02/23 Recumbent bike, level 3 x 5' Standing R quads stretch with a strap/towel x 30" x 3 Gastrocnemius slant board stretch x 30" x 3 Seated R hamstring stretch x 30" x 3 Seated R piriformis stretch x 30" x 3 Supine R patellar mobs on all planes, 2' total R Forward step ups x 6" box x 10 x 2 Step downs (heel taps) on half a foam roll x 10 R side step ups x 6" x 10 Standing R ITB stretch x 30" x 2  06/29/23 NuStep, level 3, seat 7, [place], 5 ' Standing R quads stretch with a strap/towel x 30" x 3 Gastrocnemius slant board stretch x 30" x 3 Seated R hamstring stretch x 30" x 3 Supine R patellar mobs on all planes, 2' total Bridging with hip abd, GTB x 3" x 10 x 2 Mini squats with hip abd, GTB x 3" x 10 x 2 Mini squats with hip adductor squeeze x 3" x 10 R lunges with lateral pull from GTB x 3" x 10 x 2   PATIENT EDUCATION:  Education  details: Educated on the pathoanatomy of R knee pain and arthritis. Educated on the goals and course of rehab.  Person educated: Patient Education method: Explanation Education comprehension: verbalized understanding  HOME EXERCISE PROGRAM: None provided at evaluation  Access Code: [email or web address] URL: [email or web address] 07/[email or web address]/23 - Seated Piriformis Stretch  - 1-2 x daily - 7 x weekly - 3 reps - 30 hold - ITB Stretch at Wall  - 1-2 x daily - 7 x weekly - 3 reps - 30 hold  06/18/2023 - Seated Calf Stretch with Strap  - 1-2 x daily - 7 x weekly - 3 reps - 30 hold - Standing Quad Stretch with Towel and Arm Support  - 1-2 x daily - 7 x weekly - 3 reps - 30 hold - Seated Hamstring Stretch  - 1-2 x daily - 7 x weekly - 3 reps - 30 hold - Bridge with Hip Abduction and Resistance  - 1-2 x daily - 7 x weekly - 2 sets - 10 reps - 3 hold - Clamshell with Resistance  - 1-2 x daily - 7 x weekly - 2 sets - 10 reps - Mini Squat with Counter Support  - 1-2 x daily - 7 x weekly - 2 sets - 10 reps - 3 hold   Date: 06/14/2023 Prepared by: Emeline Gins Exercises - Supine Bridge  - 2 x daily - 7 x weekly - 1 sets - 10 reps - Supine Active Straight Leg Raise  - 2 x daily - 7 x weekly - 1 sets - 10 reps - Sidelying Hip Abduction  - 2 x daily - 7 x  weekly - 1 sets - 10 reps - Seated Long Arc Quad  - 2 x daily - 7 x weekly - 1 sets - 10 reps - 5 sec hold - Seated March  - 2 x daily - 7 x weekly - 1 sets - 10 reps - Standing Hip Abduction with Counter Support  - 2 x daily - 7 x weekly - 1 sets - 10 reps - Standing Hip Extension with Counter Support  - 2 x daily - 7 x weekly - 1 sets - 10 reps - Sit to Stand  - 2 x daily - 7 x weekly - 1 sets - 10 reps   ASSESSMENT:  CLINICAL IMPRESSION: Pt able to ride her horse for three hours yesterday.  Progressed to standing balance activities as well as resisted side step  and leg machines.  Pt tends to allow knee to go into vagus with squat activity used  mirror to improve form.  Tolerated all activities without worsening of symptoms Demonstrated appropriate levels of fatigue. Provided slight amount of cueing to ensure correct execution of activity with good carry-over. Pt will continue to benefit from skilled PT to improve strength and balance.    OBJECTIVE IMPAIRMENTS: decreased ROM, decreased strength, hypomobility, impaired flexibility, and pain.   ACTIVITY LIMITATIONS: carrying, lifting, bending, sitting, standing, squatting, and stairs  PARTICIPATION LIMITATIONS: meal prep, cleaning, laundry, driving, community activity, occupation, and yard work  PERSONAL FACTORS: Time since onset of injury/illness/exacerbation are also affecting patient's functional outcome.   REHAB POTENTIAL: Good  CLINICAL DECISION MAKING: Stable/uncomplicated  EVALUATION COMPLEXITY: Low   GOALS: Goals reviewed with patient? Yes  SHORT TERM GOALS: Target date: 06/23/23 Pt will demonstrate indep in HEP to facilitate carry-over of skilled services and improve functional outcomes    Goal Status:   MET  LONG TERM GOALS: Target date: 08/03/23  Pt will increase FOTO to at least 60 in order to demonstrate significant improvement in function related to stair negotiation, ambulation, and ADLs Baseline: 46.31 Goal status:  IN PROGRESS  2.  Pt will demonstrate increase in LE strength to 4+ to facilitate ease and safety in ambulation Baseline: 3+/5 Goal status:  IN PROGRESS  3.  Patient will demonstrate increase in knee flex ROM to 110-120 degrees to facilitate ease in ambulation and stair negotiation Baseline: 85 degrees Goal status:  MET  4.  Pt will demonstrate improved flexibility in the LEs to mild restriction to facilitate ease in ambulation and ADLs Baseline: moderate restriction Goal status:  IN PROGRESS  5.  Pt will increase by at least 40 ft in order to demonstrate clinically significant improvement in community ambulation Baseline: 491 ft Goal  status:  MET    PLAN:  PT FREQUENCY: 2x/week  PT DURATION: 4 weeks  PLANNED INTERVENTIONS: Therapeutic exercises, Therapeutic activity, Neuromuscular re-education, Balance training, Gait training, Patient/Family education, Self Care, Joint mobilization, Dry Needling, Electrical stimulation, Cryotherapy, Moist heat, Taping, and Manual therapy  PLAN FOR NEXT SESSION: Continue POC and may progress as tolerated with emphasis on LE strengthening, mobility, and flexibility activities emphasizing on preventing excessive knee valgus on functional activities.  Donnamae Jude PT/CLT [phone]  8:41 AM, 07/08/23

## 2023-07-14 ENCOUNTER — Ambulatory Visit (HOSPITAL_COMMUNITY): Payer: Medicare Other | Admitting: Physical Therapy

## 2023-07-14 DIAGNOSIS — M25561 Pain in right knee: Secondary | ICD-10-CM | POA: Diagnosis not present

## 2023-07-14 DIAGNOSIS — G8929 Other chronic pain: Secondary | ICD-10-CM

## 2023-07-14 NOTE — Therapy (Signed)
OUTPATIENT PHYSICAL THERAPY TREATMENT  Patient Name: Monique [name] MRN: [id number] DOB:1956/01/06, 67 y.o., female Today's Date: 07/14/2023  END OF SESSION:   PT End of Session - 07/14/23 0946     Visit Number 10    Number of Visits 16    Date for PT Re-Evaluation 08/03/23    Authorization Type Medicare (A&B) and [employer]    Progress Note Due on Visit 18    PT Start Time 0933    PT Stop Time 1013    PT Time Calculation (min) 40 min    Activity Tolerance Patient tolerated treatment well    Behavior During Therapy [facility] for tasks assessed/performed               Past Medical History:  Diagnosis Date   Colon cancer screening 03/22/2023   Essential hypertension 02/13/2016   Hyperlipidemia    Hypothyroidism    Long-term current use of benzodiazepine 04/05/2023   Unspecified hypothyroidism 03/02/2013   Past Surgical History:  Procedure Laterality Date   ENDOMETRIAL ABLATION     ROTATOR CUFF REPAIR  20 years ago   WISDOM TOOTH EXTRACTION  09/22/1971   Patient Active Problem List   Diagnosis Date Noted   PTSD (post-traumatic stress disorder) 04/05/2023   Generalized anxiety disorder 04/05/2023   Major depressive disorder, single episode, moderate (HCC) 04/05/2023   Colon cancer screening 03/22/2023   Coronary artery calcification seen on CT scan 10/19/2019   Abnormal screening cardiac CT 11/30/2018   Severe obesity (BMI 35.0-39.9) with comorbidity (HCC) 08/24/2018   Essential hypertension 02/13/2016   Mixed hyperlipidemia 02/13/2016   Primary insomnia 05/25/2014   Hypothyroidism 03/02/2013   Pure hypercholesterolemia 03/02/2013    PCP: Julien Girt, PA-C  REFERRING PROVIDER: Oliver Barre, MD  REFERRING DIAG: M17.11 (ICD-10-CM) - Arthritis of right knee  THERAPY DIAG:  Chronic pain of right knee  Rationale for Evaluation and Treatment: Rehabilitation  ONSET DATE: March 2024  SUBJECTIVE:   SUBJECTIVE STATEMENT: Pt currently without pain,  however states she overdone it Saturday driving to [place] to watch her son compete in the [employer].      Evaluation: Arrives to the clinic with c/o R knee pain on occasion (see below). However, patient denies pain at the time of evaluation. Patient states that R quads and R calf feel weak. Condition started March 2024 and gradually gotten worse in time without known cause. Denies any trauma on the knee. Patient took Meloxicam but it did not help. Patient went to see her MD in April 2024 who drained her knee and gave her injections. Procedure helped briefly (2-3 days) but the pain came back. Recently, patient went to see another MD who did X-ray and revealed arthritis. MD gave injections which significantly helped and referred her to outpatient PT evaluation and management.  PERTINENT HISTORY: Anxiety, PTSD PAIN:  Are you having pain? Yes: NPRS scale: 2/10 Pain location: lateral side of R knee, across the joint line Pain description: sharp and aching Aggravating factors: riding horse, negotiating stairs (descending), prolonged sitting Relieving factors: laying down  PRECAUTIONS: None  RED FLAGS: Bowel or bladder incontinence: No   WEIGHT BEARING RESTRICTIONS: No  FALLS:  Has patient fallen in last 6 months? 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  No acute injuries are noted.  Mild valgus alignment overall.  There are advanced degenerative changes within all 3  compartments.  There are associated osteophytes.  There appears to be some subchondral cysts within the medial aspect of the knee.  No bony lesions.   Impression: Right knee x-rays with moderate to severe degenerative changes  PATIENT SURVEYS:  FOTO 07/06/23: 57.36 from 46.31  COGNITION: Overall cognitive status: Within functional limits for tasks assessed     SENSATION: Not tested  MUSCLE LENGTH: 07/06/23 Hamstrings: moderate restriction on B Gastrocnemius: moderate restriction on B Piriformis: WFL  POSTURE:  Standing: B  genu valgus (R>L), slight pronation R foot  PALPATION: 07/06/23 No tenderness on major bony landmarks of the R knee Hypomobile on R patella towards inferior and medial glide  LOWER EXTREMITY ROM:  Active ROM Right eval Left eval Right 07/06/23  Hip flexion [facility] WFL  Hip extension [facility] WFL  Hip abduction [facility] WFL  Hip adduction     Hip internal rotation     Hip external rotation     Knee flexion 85 WFL 120  Knee extension [facility] WFL  Ankle dorsiflexion [facility] WFL  Ankle plantarflexion [facility] WFL  Ankle inversion     Ankle eversion      (Blank rows = not tested)  LOWER EXTREMITY MMT:  MMT Right eval Left eval Right 07/06/23 Left 07/06/23  Hip flexion 3+ 4- 4 4  Hip extension 3+ 4- 4 4  Hip abduction 3+ 4- 4 4  Hip adduction      Hip internal rotation      Hip external rotation      Knee flexion 4+ 4+ 4+ 4+  Knee extension 3+ 4 4 4   Ankle dorsiflexion 4+ 4+ 4+ 4+  Ankle plantarflexion 4+ 4+ 4+ 4+  Ankle inversion      Ankle eversion       (Blank rows = not tested)  LOWER EXTREMITY SPECIAL TESTS:  Hip special tests: Ely's test: positive  and Piriformis test: negative Knee special tests: (+) Clark's test and Waldron test on R  FUNCTIONAL TESTS:  5 times sit to stand: 07/06/23: 6.71 sec from 9.50 sec 2 minute walk test: 07/06/23: 576 ft from 491 ft  GAIT: Distance walked: 491 ft Assistive device utilized: None Level  of assistance: Complete Independence Comments: done during , no gait deviations/unsteadiness seen  STAIRS: 07/06/23: alternating pattern on ascending/descending, slight R out-toeing on R and knee valgus on R more prominent in descending   TODAY'S TREATMENT:  DATE:  07/14/23 Bike x 6 minutes, seat 8 level 3 Hamstring curl cybex  4PL x 10 Leg press 4 Pl x 10  Gastroc stretch on step 2X30" each Hamstring stretch 30" x 3 onto 12" step Single leg stance x 30" each no UE assist Vectors 10X3" each with 1 UE assist Mini squat x 15 7" step up x 10 with 1 UE assist, 2 sets  07/08/23 Bike x 6 minutes Hamstring curl cybex  4PL x 10 Leg press 3 Pl x 10  Side step with green theraband x 2 RT Slant board stretch 3 x 30" Hamstring stretch 30" x 3  Single leg stance x 3 B  Mini squat x 15 Sit to stand x 10  6" step up x 10   07/06/23 Progress note (FOTO, , 5TSTS, ROM, MMT) Recumbent bike, level 4, seat 12,  x 5' Standing R quads stretch with a strap/towel x 30" x 3 Gastrocnemius slant board stretch x 30" x 3 Seated R hamstring stretch x 30" x 3 R Forward step ups x 6" box x 10 x 2 Step downs (heel taps) on half a foam roll x 10 x 2    PATIENT EDUCATION:  Education details: Educated on the pathoanatomy of R knee pain and arthritis. Educated on the goals and course of rehab.  Person educated: Patient Education method: Explanation Education comprehension: verbalized understanding  HOME EXERCISE PROGRAM: None provided at evaluation  Access Code: [email or web address] URL: [email or web address] 07/02/23 - Seated Piriformis Stretch  - 1-2 x daily - 7 x weekly - 3 reps - 30 hold - ITB Stretch at Wall  - 1-2 x daily - 7 x weekly - 3 reps - 30 hold  06/18/2023 - Seated Calf Stretch with Strap  - 1-2 x daily - 7 x weekly - 3 reps - 30 hold - Standing  Quad Stretch with Towel and Arm Support  - 1-2 x daily - 7 x weekly - 3 reps - 30 hold - Seated Hamstring Stretch  - 1-2 x daily - 7 x weekly - 3 reps - 30 hold - Bridge with Hip Abduction and Resistance  - 1-2 x daily - 7 x weekly - 2 sets - 10 reps - 3 hold - Clamshell with Resistance  - 1-2 x daily - 7 x weekly - 2 sets - 10 reps - Mini Squat with Counter Support  - 1-2 x daily - 7 x weekly - 2 sets - 10 reps - 3 hold   Date: 06/14/2023 Prepared by: Emeline Gins Exercises - Supine Bridge  - 2 x daily - 7 x weekly - 1 sets - 10 reps - Supine Active Straight Leg Raise  - 2 x daily - 7 x weekly - 1 sets - 10 reps - Sidelying Hip Abduction  - 2 x daily - 7 x weekly - 1 sets - 10 reps - Seated Long Arc Quad  - 2 x daily - 7 x weekly - 1 sets - 10 reps - 5 sec hold - Seated March  - 2 x daily - 7 x weekly - 1 sets - 10 reps - Standing Hip Abduction with Counter Support  - 2 x daily - 7 x weekly - 1 sets - 10 reps - Standing Hip Extension with Counter Support  - 2 x daily - 7 x weekly - 1 sets - 10 reps - Sit to Stand  - 2 x daily - 7 x weekly - 1 sets - 10 reps  ASSESSMENT:  CLINICAL IMPRESSION:  Continue with focus on improving LE strength and stability while working on improving flexibility of stiff mm.  Able to increase to 4 plates on leg press machine without difficulty.  Instructed with standing gastroc stretch using step to complete also at home.  Static balance challenges continued with ability to complete full 30" hold without UE with either LE.  Vector activity added to focus on glut to further improve stability.  No pain or issues reported during or at completion or session.  No rest break needed. Pt required min cues to ensure correct form and technique. Pt will continue to benefit from skilled PT to improve strength and balance.    OBJECTIVE IMPAIRMENTS: decreased ROM, decreased strength, hypomobility, impaired flexibility, and pain.   ACTIVITY LIMITATIONS: carrying, lifting,  bending, sitting, standing, squatting, and stairs  PARTICIPATION LIMITATIONS: meal prep, cleaning, laundry, driving, community activity, occupation, and yard work  PERSONAL FACTORS: Time since onset of injury/illness/exacerbation are also affecting patient's functional outcome.   REHAB POTENTIAL: Good  CLINICAL DECISION MAKING: Stable/uncomplicated  EVALUATION COMPLEXITY: Low   GOALS: Goals reviewed with patient? Yes  SHORT TERM GOALS: Target date: 06/23/23 Pt will demonstrate indep in HEP to facilitate carry-over of skilled services and improve functional outcomes    Goal Status:   MET  LONG TERM GOALS: Target date: 08/03/23  Pt will increase FOTO to at least 60 in order to demonstrate significant improvement in function related to stair negotiation, ambulation, and ADLs Baseline: 46.31 Goal status:  IN PROGRESS  2.  Pt will demonstrate increase in LE strength to 4+ to facilitate ease and safety in ambulation Baseline: 3+/5 Goal status:  IN PROGRESS  3.  Patient will demonstrate increase in knee flex ROM to 110-120 degrees to facilitate ease in ambulation and stair negotiation Baseline: 85 degrees Goal status:  MET  4.  Pt will demonstrate improved flexibility in the LEs to mild restriction to facilitate ease in ambulation and ADLs Baseline: moderate restriction Goal status:  IN PROGRESS  5.  Pt will increase by at least 40 ft in order to demonstrate clinically significant improvement in community ambulation Baseline: 491 ft Goal status:  MET    PLAN:  PT FREQUENCY: 2x/week  PT DURATION: 4 weeks  PLANNED INTERVENTIONS: Therapeutic exercises, Therapeutic activity, Neuromuscular re-education, Balance training, Gait training, Patient/Family education, Self Care, Joint mobilization, Dry Needling, Electrical stimulation, Cryotherapy, Moist heat, Taping, and Manual therapy  PLAN FOR NEXT SESSION: Continue POC and may progress as tolerated with emphasis on LE  strengthening, mobility, and flexibility activities emphasizing on preventing excessive knee valgus on functional activities.    Lurena Nida, PTA/CLT [facility] Ph: [phone]   10:07 AM, 07/14/23

## 2023-07-16 ENCOUNTER — Ambulatory Visit (HOSPITAL_COMMUNITY): Payer: Medicare Other

## 2023-07-16 DIAGNOSIS — G8929 Other chronic pain: Secondary | ICD-10-CM

## 2023-07-16 DIAGNOSIS — M25561 Pain in right knee: Secondary | ICD-10-CM | POA: Diagnosis not present

## 2023-07-16 NOTE — Therapy (Signed)
OUTPATIENT PHYSICAL THERAPY TREATMENT  Patient Name: Neysa Barret MRN: [id number] DOB:Feb 17, 1956, 67 y.o., female Today's Date: 07/16/2023  END OF SESSION:   PT End of Session - 07/16/23 0939     Visit Number 11    Number of Visits 16    Date for PT Re-Evaluation 08/03/23    Authorization Type Medicare (A&B) and [employer]ARP    Progress Note Due on Visit 18    PT Start Time 0930    PT Stop Time 1010    PT Time Calculation (min) 40 min    Activity Tolerance Patient tolerated treatment well    Behavior During Therapy [facility] for tasks assessed/performed            Past Medical History:  Diagnosis Date   Colon cancer screening 03/22/2023   Essential hypertension 02/13/2016   Hyperlipidemia    Hypothyroidism    Long-term current use of benzodiazepine 04/05/2023   Unspecified hypothyroidism 03/02/2013   Past Surgical History:  Procedure Laterality Date   ENDOMETRIAL ABLATION     ROTATOR CUFF REPAIR  20 years ago   WISDOM TOOTH EXTRACTION  09/22/1971   Patient Active Problem List   Diagnosis Date Noted   PTSD (post-traumatic stress disorder) 04/05/2023   Generalized anxiety disorder 04/05/2023   Major depressive disorder, single episode, moderate (HCC) 04/05/2023   Colon cancer screening 03/22/2023   Coronary artery calcification seen on CT scan 10/19/2019   Abnormal screening cardiac CT 11/30/2018   Severe obesity (BMI 35.0-39.9) with comorbidity (HCC) 08/24/2018   Essential hypertension 02/13/2016   Mixed hyperlipidemia 02/13/2016   Primary insomnia 05/25/2014   Hypothyroidism 03/02/2013   Pure hypercholesterolemia 03/02/2013    PCP: Julien Girt, PA-C  REFERRING PROVIDER: Oliver Barre, MD  REFERRING DIAG: M17.11 (ICD-10-CM) - Arthritis of right knee  THERAPY DIAG:  Chronic pain of right knee  Rationale for Evaluation and Treatment: Rehabilitation  ONSET DATE: March 2024  SUBJECTIVE:   SUBJECTIVE STATEMENT: Doing well today and denies any  pain. Reported that she did a lot of walking over the weekend and she did fine and did not hurt. Patient reports that she still has issues with stair negotiation such that her knee still feels like it is going to give way and that her knee feels weak.  PROGRESS NOTE: 07/06/23: Patient denies having pain but there was one night that she had a rough night around 3 days ago. Knee feels looser now. Patient thinks that PT has helped a lot such that she is around 80% better now. Patient thinks that she needs to continue with PT on a lesser frequency it's because she still feels that the knee still feels weak especially with stairs. Patient states that she's been doing her HEP at home   Evaluation: Arrives to the clinic with c/o R knee pain on occasion (see below). However, patient denies pain at the time of evaluation. Patient states that R quads and R calf feel weak. Condition started March 2024 and gradually gotten worse in time without known cause. Denies any trauma on the knee. Patient took Meloxicam but it did not help. Patient went to see her MD in April 2024 who drained her knee and gave her injections. Procedure helped briefly (2-3 days) but the pain came back. Recently, patient went to see another MD who did X-ray and revealed arthritis. MD gave injections which significantly helped and referred her to outpatient PT evaluation and management.  PERTINENT HISTORY: Anxiety, PTSD PAIN:  Are you having pain?  Yes: NPRS scale: 2/10 Pain location: lateral side of R knee, across the joint line Pain description: sharp and aching Aggravating factors: riding horse, negotiating stairs (descending), prolonged sitting Relieving factors: laying down  PRECAUTIONS: None  RED FLAGS: Bowel or bladder incontinence: No   WEIGHT BEARING RESTRICTIONS: No  FALLS:  Has patient fallen in last 6 months? 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  No acute injuries are noted.  Mild valgus alignment overall.  There are advanced degenerative changes within all 3 compartments.  There are associated osteophytes.  There appears to be some subchondral cysts within the medial aspect of the knee.  No bony lesions.   Impression: Right knee x-rays with moderate to severe degenerative changes  PATIENT SURVEYS:  FOTO 07/06/23: 57.36 from 46.31  COGNITION: Overall cognitive status: Within functional limits for tasks assessed     SENSATION: Not tested  MUSCLE LENGTH: 07/06/23 Hamstrings: moderate restriction on B Gastrocnemius: moderate restriction on B Piriformis: WFL  POSTURE:  Standing: B  genu valgus (R>L), slight pronation R foot  PALPATION: 07/06/23 No tenderness on major bony landmarks of the R knee Hypomobile on R patella towards inferior and medial glide  LOWER EXTREMITY ROM:  Active ROM Right eval Left eval Right 07/06/23  Hip flexion [facility] WFL  Hip extension [facility] WFL  Hip abduction [facility] WFL  Hip adduction     Hip internal rotation     Hip external rotation     Knee flexion 85 WFL 120  Knee extension [facility] WFL  Ankle dorsiflexion [facility] WFL  Ankle plantarflexion [facility] WFL  Ankle inversion     Ankle eversion      (Blank rows = not tested)  LOWER EXTREMITY MMT:  MMT Right eval Left eval Right 07/06/23 Left 07/06/23  Hip flexion 3+ 4- 4 4  Hip extension 3+ 4- 4 4  Hip abduction 3+ 4- 4 4  Hip adduction      Hip internal rotation      Hip  external rotation      Knee flexion 4+ 4+ 4+ 4+  Knee extension 3+ 4 4 4   Ankle dorsiflexion 4+ 4+ 4+ 4+  Ankle plantarflexion 4+ 4+ 4+ 4+  Ankle inversion      Ankle eversion       (Blank rows = not tested)  LOWER EXTREMITY SPECIAL TESTS:  Hip special tests: Ely's test: positive  and Piriformis test: negative Knee special tests: (+) Clark's test and Waldron test on R  FUNCTIONAL TESTS:  5 times sit to stand: 07/06/23: 6.71 sec from 9.50 sec 2 minute walk test: 07/06/23: 576 ft from 491 ft  GAIT: Distance walked: 491 ft Assistive device utilized: None Level of assistance: Complete Independence Comments: done during , no gait deviations/unsteadiness seen  STAIRS: 07/06/23: alternating pattern on ascending/descending, slight R out-toeing on R and knee valgus on R more prominent in descending   TODAY'S TREATMENT:  DATE:  07/16/23 Recumbent bike, level 3, seat 8,  x 5' Standing R quads stretch with a strap/towel x 30" x 3 Gastrocnemius slant board stretch x 30" x 3 Fitter wobble board, ant/post, lat/ed, CW, CCW x 10 each with B UE support Fitter wobble board, mini squats x 3" x 10 x 2 with B UE support Fitter wobble board, lunges x 3" x 10 on each with B UE support Step downs (heel taps) on a slant board x 10 x 2 on each R step ups, 7" step x 10 x 2  07/14/23 Bike x 6 minutes, seat 8 level 3 Hamstring curl cybex  4PL x 10 Leg press 4 Pl x 10  Gastroc stretch on step 2X30" each Hamstring stretch 30" x 3 onto 12" step Single leg stance x 30" each no UE assist Vectors 10X3" each with 1 UE assist Mini squat x 15 7" step up x 10 with 1 UE assist, 2 sets  07/08/23 Bike x 6 minutes Hamstring curl cybex  4PL x 10 Leg press 3 Pl x 10  Side step with green theraband x 2 RT Slant board stretch 3 x 30" Hamstring stretch 30" x 3  Single leg stance x 3 B   Mini squat x 15 Sit to stand x 10  6" step up x 10   07/06/23 Progress note (FOTO, , 5TSTS, ROM, MMT) Recumbent bike, level 4, seat 12,  x 5' Standing R quads stretch with a strap/towel x 30" x 3 Gastrocnemius slant board stretch x 30" x 3 Seated R hamstring stretch x 30" x 3 R Forward step ups x 6" box x 10 x 2 Step downs (heel taps) on half a foam roll x 10 x 2    PATIENT EDUCATION:  Education details: Educated on the pathoanatomy of R knee pain and arthritis. Educated on the goals and course of rehab.  Person educated: Patient Education method: Explanation Education comprehension: verbalized understanding  HOME EXERCISE PROGRAM: None provided at evaluation  Access Code: [email or web address] URL: [email or web address] 07/02/23 - Seated Piriformis Stretch  - 1-2 x daily - 7 x weekly - 3 reps - 30 hold - ITB Stretch at Wall  - 1-2 x daily - 7 x weekly - 3 reps - 30 hold  06/18/2023 - Seated Calf Stretch with Strap  - 1-2 x daily - 7 x weekly - 3 reps - 30 hold - Standing Quad Stretch with Towel and Arm Support  - 1-2 x daily - 7 x weekly - 3 reps - 30 hold - Seated Hamstring Stretch  - 1-2 x daily - 7 x weekly - 3 reps - 30 hold - Bridge with Hip Abduction and Resistance  - 1-2 x daily - 7 x weekly - 2 sets - 10 reps - 3 hold - Clamshell with Resistance  - 1-2 x daily - 7 x weekly - 2 sets - 10 reps - Mini Squat with Counter Support  - 1-2 x daily - 7 x weekly - 2 sets - 10 reps - 3 hold   Date: 06/14/2023 Prepared by: Emeline Gins Exercises - Supine Bridge  - 2 x daily - 7 x weekly - 1 sets - 10 reps - Supine Active Straight Leg Raise  - 2 x daily - 7 x weekly - 1 sets - 10 reps - Sidelying Hip Abduction  - 2 x daily - 7 x weekly - 1 sets - 10 reps - Seated Long Arc Quad  - 2 x  daily - 7 x weekly - 1 sets - 10 reps - 5 sec hold - Seated March  - 2 x daily - 7 x weekly - 1 sets - 10 reps - Standing Hip Abduction with Counter Support  - 2 x daily - 7 x weekly - 1  sets - 10 reps - Standing Hip Extension with Counter Support  - 2 x daily - 7 x weekly - 1 sets - 10 reps - Sit to Stand  - 2 x daily - 7 x weekly - 1 sets - 10 reps   ASSESSMENT:  CLINICAL IMPRESSION:  Interventions today were geared towards LE strengthening, flexibility, and proprioception. Tolerated all activities without worsening of symptoms except when doing step downs where patient reported of 1/10 pain on the knee cap. Patient also reported of pain when doing step ups with leg in neutral (2/10 pain) but reported of less pain (1.5/10) when leg was in slight ER. Demonstrated appropriate levels of fatigue. Provided _ amount of cueing to ensure correct execution of activity with _ carry-over. To date, skilled PT is required to address the impairments and improve function.   PROGRESS NOTE 07/06/23: Patient demonstrated continued improvements in function as indicated by positive significant changes in FOTO, 5TSTS, and . Patient does not report of pain anymore. However, patient still presents with deficits in functional strength. With this, skilled PT is still required to address the impairments and functional limitations listed below. Interventions today were geared towards LE strengthening, and mobility. Tolerated all activities without worsening of symptoms. Reported of slight difficulty with step ups and and step downs due to weakness. Patient demonstrated excellent verbal understanding. Demonstrated appropriate levels of fatigue. Provided slight amount of cueing to ensure correct execution of activity with good carry-over.   OBJECTIVE IMPAIRMENTS: decreased ROM, decreased strength, hypomobility, impaired flexibility, and pain.   ACTIVITY LIMITATIONS: carrying, lifting, bending, sitting, standing, squatting, and stairs  PARTICIPATION LIMITATIONS: meal prep, cleaning, laundry, driving, community activity, occupation, and yard work  PERSONAL FACTORS: Time since onset of  injury/illness/exacerbation are also affecting patient's functional outcome.   REHAB POTENTIAL: Good  CLINICAL DECISION MAKING: Stable/uncomplicated  EVALUATION COMPLEXITY: Low   GOALS: Goals reviewed with patient? Yes  SHORT TERM GOALS: Target date: 06/23/23 Pt will demonstrate indep in HEP to facilitate carry-over of skilled services and improve functional outcomes    Goal Status:   MET  LONG TERM GOALS: Target date: 08/03/23  Pt will increase FOTO to at least 60 in order to demonstrate significant improvement in function related to stair negotiation, ambulation, and ADLs Baseline: 46.31 Goal status:  IN PROGRESS  2.  Pt will demonstrate increase in LE strength to 4+ to facilitate ease and safety in ambulation Baseline: 3+/5 Goal status:  IN PROGRESS  3.  Patient will demonstrate increase in knee flex ROM to 110-120 degrees to facilitate ease in ambulation and stair negotiation Baseline: 85 degrees Goal status:  MET  4.  Pt will demonstrate improved flexibility in the LEs to mild restriction to facilitate ease in ambulation and ADLs Baseline: moderate restriction Goal status:  IN PROGRESS  5.  Pt will increase by at least 40 ft in order to demonstrate clinically significant improvement in community ambulation Baseline: 491 ft Goal status:  MET    PLAN:  PT FREQUENCY: 2x/week  PT DURATION: 4 weeks  PLANNED INTERVENTIONS: Therapeutic exercises, Therapeutic activity, Neuromuscular re-education, Balance training, Gait training, Patient/Family education, Self Care, Joint mobilization, Dry Needling, Electrical stimulation, Cryotherapy, Moist heat,  Taping, and Manual therapy  PLAN FOR NEXT SESSION: Continue POC and may progress as tolerated with emphasis on LE strengthening, mobility, and flexibility activities emphasizing on preventing excessive knee valgus on functional activities.    Tish Frederickson. Jan Olano, PT, DPT, OCS Board-Certified Clinical Specialist in  Orthopedic PT PT Compact Privilege # ([place]): [phone] T [facility] Ph: [phone]   9:42 AM, 07/16/23

## 2023-07-20 ENCOUNTER — Ambulatory Visit (HOSPITAL_COMMUNITY): Payer: Medicare Other

## 2023-07-20 DIAGNOSIS — M25561 Pain in right knee: Secondary | ICD-10-CM | POA: Diagnosis not present

## 2023-07-20 DIAGNOSIS — G8929 Other chronic pain: Secondary | ICD-10-CM

## 2023-07-20 NOTE — Therapy (Signed)
OUTPATIENT PHYSICAL THERAPY TREATMENT  Patient Name: Monique [name] MRN: [id number] DOB:09-May-1956, 67 y.o., female Today's Date: 07/21/2023  END OF SESSION:   PT End of Session - 07/20/23 0808     Visit Number 12    Number of Visits 16    Date for PT Re-Evaluation 08/03/23    Authorization Type Medicare (A&B) and AARP    Progress Note Due on Visit 18    PT Start Time 0800    PT Stop Time 0845    PT Time Calculation (min) 45 min    Activity Tolerance Patient tolerated treatment well    Behavior During Therapy [facility] for tasks assessed/performed             Past Medical History:  Diagnosis Date   Colon cancer screening 03/22/2023   Essential hypertension 02/13/2016   Hyperlipidemia    Hypothyroidism    Long-term current use of benzodiazepine 04/05/2023   Unspecified hypothyroidism 03/02/2013   Past Surgical History:  Procedure Laterality Date   ENDOMETRIAL ABLATION     ROTATOR CUFF REPAIR  20 years ago   WISDOM TOOTH EXTRACTION  09/22/1971   Patient Active Problem List   Diagnosis Date Noted   PTSD (post-traumatic stress disorder) 04/05/2023   Generalized anxiety disorder 04/05/2023   Major depressive disorder, single episode, moderate (HCC) 04/05/2023   Colon cancer screening 03/22/2023   Coronary artery calcification seen on CT scan 10/19/2019   Abnormal screening cardiac CT 11/30/2018   Severe obesity (BMI 35.0-39.9) with comorbidity (HCC) 08/24/2018   Essential hypertension 02/13/2016   Mixed hyperlipidemia 02/13/2016   Primary insomnia 05/25/2014   Hypothyroidism 03/02/2013   Pure hypercholesterolemia 03/02/2013    PCP: Julien Girt, PA-C  REFERRING PROVIDER: Oliver Barre, MD  REFERRING DIAG: M17.11 (ICD-10-CM) - Arthritis of right knee  THERAPY DIAG:  Chronic pain of right knee  Rationale for Evaluation and Treatment: Rehabilitation  ONSET DATE: March 2024  SUBJECTIVE:   SUBJECTIVE STATEMENT: Doing well today and denies any  pain. Patient states that she's doing a lot better. Knee still feels that it is going to buckle with stair negotiation but is relying less on her arms now to go up. Did not get sore from the last session  PROGRESS NOTE: 07/06/23: Patient denies having pain but there was one night that she had a rough night around 3 days ago. Knee feels looser now. Patient thinks that PT has helped a lot such that she is around 80% better now. Patient thinks that she needs to continue with PT on a lesser frequency it's because she still feels that the knee still feels weak especially with stairs. Patient states that she's been doing her HEP at home   Evaluation: Arrives to the clinic with c/o R knee pain on occasion (see below). However, patient denies pain at the time of evaluation. Patient states that R quads and R calf feel weak. Condition started March 2024 and gradually gotten worse in time without known cause. Denies any trauma on the knee. Patient took Meloxicam but it did not help. Patient went to see her MD in April 2024 who drained her knee and gave her injections. Procedure helped briefly (2-3 days) but the pain came back. Recently, patient went to see another MD who did X-ray and revealed arthritis. MD gave injections which significantly helped and referred her to outpatient PT evaluation and management.  PERTINENT HISTORY: Anxiety, PTSD PAIN:  Are you having pain? Yes: NPRS scale: 2/10 Pain location: lateral side  of R knee, across the joint line Pain description: sharp and aching Aggravating factors: riding horse, negotiating stairs (descending), prolonged sitting Relieving factors: laying down  PRECAUTIONS: None  RED FLAGS: Bowel or bladder incontinence: No   WEIGHT BEARING RESTRICTIONS: No  FALLS:  Has patient fallen in last 6 months? 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  No acute injuries are noted.  Mild valgus alignment overall.  There are advanced degenerative changes within all 3 compartments.  There are associated osteophytes.  There appears to be some subchondral cysts within the medial aspect of the knee.  No bony lesions.   Impression: Right knee x-rays with moderate to severe degenerative changes  PATIENT SURVEYS:  FOTO 07/06/23: 57.36 from 46.31  COGNITION: Overall cognitive status: Within functional limits for tasks assessed     SENSATION: Not tested  MUSCLE LENGTH: 07/06/23 Hamstrings: moderate restriction on B Gastrocnemius: moderate restriction on B Piriformis: WFL  POSTURE:  Standing: B  genu valgus (R>L), slight pronation R foot  PALPATION: 07/06/23 No tenderness on major bony landmarks of the R knee Hypomobile on R patella towards inferior and medial glide  LOWER EXTREMITY ROM:  Active ROM Right eval Left eval Right 07/06/23  Hip flexion [facility] WFL  Hip extension [facility] WFL  Hip abduction [facility] WFL  Hip adduction     Hip internal rotation     Hip external rotation     Knee flexion 85 WFL 120  Knee extension [facility] WFL  Ankle dorsiflexion [facility] WFL  Ankle plantarflexion [facility] WFL  Ankle inversion     Ankle eversion      (Blank rows = not tested)  LOWER EXTREMITY MMT:  MMT Right eval Left eval Right 07/06/23 Left 07/06/23  Hip flexion 3+ 4- 4 4  Hip extension 3+ 4- 4 4  Hip abduction 3+ 4- 4 4  Hip adduction      Hip internal rotation      Hip external rotation      Knee flexion 4+ 4+ 4+  4+  Knee extension 3+ 4 4 4   Ankle dorsiflexion 4+ 4+ 4+ 4+  Ankle plantarflexion 4+ 4+ 4+ 4+  Ankle inversion      Ankle eversion       (Blank rows = not tested)  LOWER EXTREMITY SPECIAL TESTS:  Hip special tests: Ely's test: positive  and Piriformis test: negative Knee special tests: (+) Clark's test and Waldron test on R  FUNCTIONAL TESTS:  5 times sit to stand: 07/06/23: 6.71 sec from 9.50 sec 2 minute walk test: 07/06/23: 576 ft from 491 ft  GAIT: Distance walked: 491 ft Assistive device utilized: None Level of assistance: Complete Independence Comments: done during , no gait deviations/unsteadiness seen  STAIRS: 07/06/23: alternating pattern on ascending/descending, slight R out-toeing on R and knee valgus on R more prominent in descending   TODAY'S TREATMENT:  DATE:  07/20/23 NuStep, seat 9, level 4 x 5' Standing R quads stretch with a strap/towel x 30" x 3 Gastrocnemius slant board stretch x 30" x 3 Fitter wobble board, ant/post, lat/ed, CW, CCW x 10 each with B UE support Fitter wobble board, mini squats x 3" x 10 x 2 with B UE support Fitter wobble board, lunges x 3" x 10 on each with B UE support Runner's step ups, 4" box x 10 x 2 on each Stair negotiation (2 rounds on 4" step, 2 rounds, 7" step), no railings TKE, 2 plates x 3" x 10 x 2 on each  07/16/23 Recumbent bike, level 3, seat 8,  x 5' Standing R quads stretch with a strap/towel x 30" x 3 Gastrocnemius slant board stretch x 30" x 3 Fitter wobble board, ant/post, lat/ed, CW, CCW x 10 each with B UE support Fitter wobble board, mini squats x 3" x 10 x 2 with B UE support Fitter wobble board, lunges x 3" x 10 on each with B UE support Step downs (heel taps) on a slant board x 10 x 2 on each R step ups, 7" step x 10 x 2  07/14/23 Bike x 6 minutes, seat 8 level 3 Hamstring curl  cybex  4PL x 10 Leg press 4 Pl x 10  Gastroc stretch on step 2X30" each Hamstring stretch 30" x 3 onto 12" step Single leg stance x 30" each no UE assist Vectors 10X3" each with 1 UE assist Mini squat x 15 7" step up x 10 with 1 UE assist, 2 sets  07/08/23 Bike x 6 minutes Hamstring curl cybex  4PL x 10 Leg press 3 Pl x 10  Side step with green theraband x 2 RT Slant board stretch 3 x 30" Hamstring stretch 30" x 3  Single leg stance x 3 B  Mini squat x 15 Sit to stand x 10  6" step up x 10   07/06/23 Progress note (FOTO, , 5TSTS, ROM, MMT) Recumbent bike, level 4, seat 12,  x 5' Standing R quads stretch with a strap/towel x 30" x 3 Gastrocnemius slant board stretch x 30" x 3 Seated R hamstring stretch x 30" x 3 R Forward step ups x 6" box x 10 x 2 Step downs (heel taps) on half a foam roll x 10 x 2    PATIENT EDUCATION:  Education details: Educated on the pathoanatomy of R knee pain and arthritis. Educated on the goals and course of rehab.  Person educated: Patient Education method: Explanation Education comprehension: verbalized understanding  HOME EXERCISE PROGRAM: None provided at evaluation  Access Code: [email or web address] URL: [email or web address] 07/02/23 - Seated Piriformis Stretch  - 1-2 x daily - 7 x weekly - 3 reps - 30 hold - ITB Stretch at Wall  - 1-2 x daily - 7 x weekly - 3 reps - 30 hold  06/18/2023 - Seated Calf Stretch with Strap  - 1-2 x daily - 7 x weekly - 3 reps - 30 hold - Standing Quad Stretch with Towel and Arm Support  - 1-2 x daily - 7 x weekly - 3 reps - 30 hold - Seated Hamstring Stretch  - 1-2 x daily - 7 x weekly - 3 reps - 30 hold - Bridge with Hip Abduction and Resistance  - 1-2 x daily - 7 x weekly - 2 sets - 10 reps - 3 hold - Clamshell with Resistance  - 1-2 x daily - 7 x weekly -  2 sets - 10 reps - Mini Squat with Counter Support  - 1-2 x daily - 7 x weekly - 2 sets - 10 reps - 3 hold   Date: 06/14/2023 Prepared  by: Emeline Gins Exercises - Supine Bridge  - 2 x daily - 7 x weekly - 1 sets - 10 reps - Supine Active Straight Leg Raise  - 2 x daily - 7 x weekly - 1 sets - 10 reps - Sidelying Hip Abduction  - 2 x daily - 7 x weekly - 1 sets - 10 reps - Seated Long Arc Quad  - 2 x daily - 7 x weekly - 1 sets - 10 reps - 5 sec hold - Seated March  - 2 x daily - 7 x weekly - 1 sets - 10 reps - Standing Hip Abduction with Counter Support  - 2 x daily - 7 x weekly - 1 sets - 10 reps - Standing Hip Extension with Counter Support  - 2 x daily - 7 x weekly - 1 sets - 10 reps - Sit to Stand  - 2 x daily - 7 x weekly - 1 sets - 10 reps   ASSESSMENT:  CLINICAL IMPRESSION:  Interventions today were geared towards LE strengthening, flexibility, and proprioception. Tolerated all activities without worsening of symptoms. Demonstrated appropriate levels of fatigue. Provided mod amount of multimodal cueing to ensure correct execution of activity with good carry-over especially with runner's step ups. Patient did not report of knee giving way on the stairs. Able to do stairs (7") without railings and without difficulty. To date, skilled PT is required to address the impairments and improve function.   PROGRESS NOTE 07/06/23: Patient demonstrated continued improvements in function as indicated by positive significant changes in FOTO, 5TSTS, and . Patient does not report of pain anymore. However, patient still presents with deficits in functional strength. With this, skilled PT is still required to address the impairments and functional limitations listed below. Interventions today were geared towards LE strengthening, and mobility. Tolerated all activities without worsening of symptoms. Reported of slight difficulty with step ups and and step downs due to weakness. Patient demonstrated excellent verbal understanding. Demonstrated appropriate levels of fatigue. Provided slight amount of cueing to ensure correct execution of  activity with good carry-over.   OBJECTIVE IMPAIRMENTS: decreased ROM, decreased strength, hypomobility, impaired flexibility, and pain.   ACTIVITY LIMITATIONS: carrying, lifting, bending, sitting, standing, squatting, and stairs  PARTICIPATION LIMITATIONS: meal prep, cleaning, laundry, driving, community activity, occupation, and yard work  PERSONAL FACTORS: Time since onset of injury/illness/exacerbation are also affecting patient's functional outcome.   REHAB POTENTIAL: Good  CLINICAL DECISION MAKING: Stable/uncomplicated  EVALUATION COMPLEXITY: Low   GOALS: Goals reviewed with patient? Yes  SHORT TERM GOALS: Target date: 06/23/23 Pt will demonstrate indep in HEP to facilitate carry-over of skilled services and improve functional outcomes    Goal Status:   MET  LONG TERM GOALS: Target date: 08/03/23  Pt will increase FOTO to at least 60 in order to demonstrate significant improvement in function related to stair negotiation, ambulation, and ADLs Baseline: 46.31 Goal status:  IN PROGRESS  2.  Pt will demonstrate increase in LE strength to 4+ to facilitate ease and safety in ambulation Baseline: 3+/5 Goal status:  IN PROGRESS  3.  Patient will demonstrate increase in knee flex ROM to 110-120 degrees to facilitate ease in ambulation and stair negotiation Baseline: 85 degrees Goal status:  MET  4.  Pt will demonstrate improved  flexibility in the LEs to mild restriction to facilitate ease in ambulation and ADLs Baseline: moderate restriction Goal status:  IN PROGRESS  5.  Pt will increase by at least 40 ft in order to demonstrate clinically significant improvement in community ambulation Baseline: 491 ft Goal status:  MET    PLAN:  PT FREQUENCY: 2x/week  PT DURATION: 4 weeks  PLANNED INTERVENTIONS: Therapeutic exercises, Therapeutic activity, Neuromuscular re-education, Balance training, Gait training, Patient/Family education, Self Care, Joint mobilization,  Dry Needling, Electrical stimulation, Cryotherapy, Moist heat, Taping, and Manual therapy  PLAN FOR NEXT SESSION: Continue POC and may progress as tolerated with emphasis on LE strengthening, mobility, and flexibility activities emphasizing on preventing excessive knee valgus on functional activities.    Tish Frederickson. Trey Bebee, PT, DPT, OCS Board-Certified Clinical Specialist in Orthopedic PT PT Compact Privilege # ([place]): [phone] [facility] Ph: [phone]   10:09 AM, 07/21/23

## 2023-07-22 ENCOUNTER — Ambulatory Visit (HOSPITAL_COMMUNITY): Payer: Medicare Other

## 2023-07-22 DIAGNOSIS — M25561 Pain in right knee: Secondary | ICD-10-CM | POA: Diagnosis not present

## 2023-07-22 DIAGNOSIS — G8929 Other chronic pain: Secondary | ICD-10-CM

## 2023-07-22 NOTE — Therapy (Signed)
OUTPATIENT PHYSICAL THERAPY TREATMENT  Patient Name: Sugar Mutchler MRN: [id number] DOB:06-01-1956, 67 y.o., female Today's Date: 07/22/2023  END OF SESSION:   PT End of Session - 07/22/23 0805     Visit Number 13    Number of Visits 16    Date for PT Re-Evaluation 08/03/23    Authorization Type Medicare (A&B) and AARP    PT Start Time 0800    PT Stop Time 0845    PT Time Calculation (min) 45 min    Activity Tolerance Patient tolerated treatment well    Behavior During Therapy [facility] for tasks assessed/performed             Past Medical History:  Diagnosis Date   Colon cancer screening 03/22/2023   Essential hypertension 02/13/2016   Hyperlipidemia    Hypothyroidism    Long-term current use of benzodiazepine 04/05/2023   Unspecified hypothyroidism 03/02/2013   Past Surgical History:  Procedure Laterality Date   ENDOMETRIAL ABLATION     ROTATOR CUFF REPAIR  20 years ago   WISDOM TOOTH EXTRACTION  09/22/1971   Patient Active Problem List   Diagnosis Date Noted   PTSD (post-traumatic stress disorder) 04/05/2023   Generalized anxiety disorder 04/05/2023   Major depressive disorder, single episode, moderate (HCC) 04/05/2023   Colon cancer screening 03/22/2023   Coronary artery calcification seen on CT scan 10/19/2019   Abnormal screening cardiac CT 11/30/2018   Severe obesity (BMI 35.0-39.9) with comorbidity (HCC) 08/24/2018   Essential hypertension 02/13/2016   Mixed hyperlipidemia 02/13/2016   Primary insomnia 05/25/2014   Hypothyroidism 03/02/2013   Pure hypercholesterolemia 03/02/2013    PCP: Julien Girt, PA-C  REFERRING PROVIDER: Oliver Barre, MD  REFERRING DIAG: M17.11 (ICD-10-CM) - Arthritis of right knee  THERAPY DIAG:  Chronic pain of right knee  Rationale for Evaluation and Treatment: Rehabilitation  ONSET DATE: March 2024  SUBJECTIVE:   SUBJECTIVE STATEMENT: Doing well today and denies any pain. Patient reports that she was  able to climb up or down the stairs yesterday without any issues. Did a lot of chores that involved frequent stair negotiation which made her knee hurt last night. Patient reports that she's doing much better now.  PROGRESS NOTE: 07/06/23: Patient denies having pain but there was one night that she had a rough night around 3 days ago. Knee feels looser now. Patient thinks that PT has helped a lot such that she is around 80% better now. Patient thinks that she needs to continue with PT on a lesser frequency it's because she still feels that the knee still feels weak especially with stairs. Patient states that she's been doing her HEP at home   Evaluation: Arrives to the clinic with c/o R knee pain on occasion (see below). However, patient denies pain at the time of evaluation. Patient states that R quads and R calf feel weak. Condition started March 2024 and gradually gotten worse in time without known cause. Denies any trauma on the knee. Patient took Meloxicam but it did not help. Patient went to see her MD in April 2024 who drained her knee and gave her injections. Procedure helped briefly (2-3 days) but the pain came back. Recently, patient went to see another MD who did X-ray and revealed arthritis. MD gave injections which significantly helped and referred her to outpatient PT evaluation and management.  PERTINENT HISTORY: Anxiety, PTSD PAIN:  Are you having pain? Yes: NPRS scale: 2/10 Pain location: lateral side of R knee, across the joint  line Pain description: sharp and aching Aggravating factors: riding horse, negotiating stairs (descending), prolonged sitting Relieving factors: laying down  PRECAUTIONS: None  RED FLAGS: Bowel or bladder incontinence: No   WEIGHT BEARING RESTRICTIONS: No  FALLS:  Has patient fallen in last 6 months? 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  No acute injuries are noted.  Mild valgus alignment overall.  There are advanced degenerative changes within all 3 compartments.  There are associated osteophytes.  There appears to be some subchondral cysts within the medial aspect of the knee.  No bony lesions.   Impression: Right knee x-rays with moderate to severe degenerative changes  PATIENT SURVEYS:  FOTO 07/06/23: 57.36 from 46.31  COGNITION: Overall cognitive status: Within functional limits for tasks assessed     SENSATION: Not tested  MUSCLE LENGTH: 07/06/23 Hamstrings: moderate restriction on B Gastrocnemius: moderate restriction on B Piriformis: WFL  POSTURE:  Standing: B  genu valgus (R>L), slight pronation R foot  PALPATION: 07/06/23 No tenderness on major bony landmarks of the R knee Hypomobile on R patella towards inferior and medial glide  LOWER EXTREMITY ROM:  Active ROM Right eval Left eval Right 07/06/23  Hip flexion [facility] WFL  Hip extension [facility] WFL  Hip abduction [facility] WFL  Hip adduction     Hip internal rotation     Hip external rotation     Knee flexion 85 WFL 120  Knee extension [facility] WFL  Ankle dorsiflexion [facility] WFL  Ankle plantarflexion [facility] WFL  Ankle inversion     Ankle eversion      (Blank rows = not tested)  LOWER EXTREMITY MMT:  MMT Right eval Left eval Right 07/06/23 Left 07/06/23  Hip flexion 3+ 4- 4 4  Hip extension 3+ 4- 4 4  Hip abduction 3+ 4- 4 4  Hip adduction      Hip internal rotation      Hip external rotation      Knee flexion 4+  4+ 4+ 4+  Knee extension 3+ 4 4 4   Ankle dorsiflexion 4+ 4+ 4+ 4+  Ankle plantarflexion 4+ 4+ 4+ 4+  Ankle inversion      Ankle eversion       (Blank rows = not tested)  LOWER EXTREMITY SPECIAL TESTS:  Hip special tests: Ely's test: positive  and Piriformis test: negative Knee special tests: (+) Clark's test and Waldron test on R  FUNCTIONAL TESTS:  5 times sit to stand: 07/06/23: 6.71 sec from 9.50 sec 2 minute walk test: 07/06/23: 576 ft from 491 ft  GAIT: Distance walked: 491 ft Assistive device utilized: None Level of assistance: Complete Independence Comments: done during , no gait deviations/unsteadiness seen  STAIRS: 07/06/23: alternating pattern on ascending/descending, slight R out-toeing on R and knee valgus on R more prominent in descending   TODAY'S TREATMENT:  DATE:  07/22/23 NuStep, seat 9, level 4 x 5' Cybex hamstring curls, 4 plates, 10 x 2 Double leg press, 3 plates, 3" x 10 x 2 R leg press, 2 plates, 3" x 10 x 2 Standing R quads stretch with a strap/towel x 30" x 3 Gastrocnemius slant board stretch x 30" x 3 Seated R hamstring stretch x 30" x 3 Fitter wobble board, ant/post, lat/ed, CW, CCW x 10 each with B UE support Fitter wobble board, mini squats, RTB x 3" x 10 x 2 with B UE support Fitter wobble board, R lunges with med pull RTB x 3" x 10 on each with B UE support R gluteal dips x 10 x 2 with B UE support Runner's step ups, 6" box x 10 x 2 on each   07/20/23 NuStep, seat 9, level 4 x 5' Standing R quads stretch with a strap/towel x 30" x 3 Gastrocnemius slant board stretch x 30" x 3 Fitter wobble board, ant/post, lat/ed, CW, CCW x 10 each with B UE support Fitter wobble board, mini squats x 3" x 10 x 2 with B UE support Fitter wobble board, lunges x 3" x 10 on each with B UE support Runner's step ups, 4" box x 10 x 2 on  each Stair negotiation (2 rounds on 4" step, 2 rounds, 7" step), no railings TKE, 2 plates x 3" x 10 x 2 on each  07/16/23 Recumbent bike, level 3, seat 8,  x 5' Standing R quads stretch with a strap/towel x 30" x 3 Gastrocnemius slant board stretch x 30" x 3 Fitter wobble board, ant/post, lat/ed, CW, CCW x 10 each with B UE support Fitter wobble board, mini squats x 3" x 10 x 2 with B UE support Fitter wobble board, lunges x 3" x 10 on each with B UE support Step downs (heel taps) on a slant board x 10 x 2 on each R step ups, 7" step x 10 x 2  07/14/23 Bike x 6 minutes, seat 8 level 3 Hamstring curl cybex  4PL x 10 Leg press 4 Pl x 10  Gastroc stretch on step 2X30" each Hamstring stretch 30" x 3 onto 12" step Single leg stance x 30" each no UE assist Vectors 10X3" each with 1 UE assist Mini squat x 15 7" step up x 10 with 1 UE assist, 2 sets  07/08/23 Bike x 6 minutes Hamstring curl cybex  4PL x 10 Leg press 3 Pl x 10  Side step with green theraband x 2 RT Slant board stretch 3 x 30" Hamstring stretch 30" x 3  Single leg stance x 3 B  Mini squat x 15 Sit to stand x 10  6" step up x 10   07/06/23 Progress note (FOTO, , 5TSTS, ROM, MMT) Recumbent bike, level 4, seat 12,  x 5' Standing R quads stretch with a strap/towel x 30" x 3 Gastrocnemius slant board stretch x 30" x 3 Seated R hamstring stretch x 30" x 3 R Forward step ups x 6" box x 10 x 2 Step downs (heel taps) on half a foam roll x 10 x 2    PATIENT EDUCATION:  Education details: Educated on the pathoanatomy of R knee pain and arthritis. Educated on the goals and course of rehab.  Person educated: Patient Education method: Explanation Education comprehension: verbalized understanding  HOME EXERCISE PROGRAM: None provided at evaluation  Access Code: [email or web address] URL: [email or web address] 07/[email or web address]/23 - Seated Piriformis Stretch  - 1-2  x daily - 7 x weekly - 3 reps - 30 hold - ITB Stretch  at Wall  - 1-2 x daily - 7 x weekly - 3 reps - 30 hold  06/18/2023 - Seated Calf Stretch with Strap  - 1-2 x daily - 7 x weekly - 3 reps - 30 hold - Standing Quad Stretch with Towel and Arm Support  - 1-2 x daily - 7 x weekly - 3 reps - 30 hold - Seated Hamstring Stretch  - 1-2 x daily - 7 x weekly - 3 reps - 30 hold - Bridge with Hip Abduction and Resistance  - 1-2 x daily - 7 x weekly - 2 sets - 10 reps - 3 hold - Clamshell with Resistance  - 1-2 x daily - 7 x weekly - 2 sets - 10 reps - Mini Squat with Counter Support  - 1-2 x daily - 7 x weekly - 2 sets - 10 reps - 3 hold   Date: 06/14/2023 Prepared by: Emeline Gins Exercises - Supine Bridge  - 2 x daily - 7 x weekly - 1 sets - 10 reps - Supine Active Straight Leg Raise  - 2 x daily - 7 x weekly - 1 sets - 10 reps - Sidelying Hip Abduction  - 2 x daily - 7 x weekly - 1 sets - 10 reps - Seated Long Arc Quad  - 2 x daily - 7 x weekly - 1 sets - 10 reps - 5 sec hold - Seated March  - 2 x daily - 7 x weekly - 1 sets - 10 reps - Standing Hip Abduction with Counter Support  - 2 x daily - 7 x weekly - 1 sets - 10 reps - Standing Hip Extension with Counter Support  - 2 x daily - 7 x weekly - 1 sets - 10 reps - Sit to Stand  - 2 x daily - 7 x weekly - 1 sets - 10 reps   ASSESSMENT:  CLINICAL IMPRESSION:  Interventions today were geared towards LE strengthening, flexibility, and proprioception. Tolerated all activities without worsening of symptoms. Attempted to put an adductor squeeze on the double leg press but patient reported of catching on the knee so it was discontinued. Also reported of some knee discomfort =1?10 when doing the runner's step ups on the R. Demonstrated appropriate levels of fatigue. Provided mod amount of multimodal cueing to ensure correct execution of activity with good carry-over especially with runner's step ups. Patient did not report of knee giving way on the stairs. Able to do stairs (7") without railings and without  difficulty. To date, skilled PT is required to address the impairments and improve function.   PROGRESS NOTE 07/06/23: Patient demonstrated continued improvements in function as indicated by positive significant changes in FOTO, 5TSTS, and . Patient does not report of pain anymore. However, patient still presents with deficits in functional strength. With this, skilled PT is still required to address the impairments and functional limitations listed below. Interventions today were geared towards LE strengthening, and mobility. Tolerated all activities without worsening of symptoms. Reported of slight difficulty with step ups and and step downs due to weakness. Patient demonstrated excellent verbal understanding. Demonstrated appropriate levels of fatigue. Provided slight amount of cueing to ensure correct execution of activity with good carry-over.   OBJECTIVE IMPAIRMENTS: decreased ROM, decreased strength, hypomobility, impaired flexibility, and pain.   ACTIVITY LIMITATIONS: carrying, lifting, bending, sitting, standing, squatting, and stairs  PARTICIPATION LIMITATIONS: meal prep,  cleaning, laundry, driving, community activity, occupation, and yard work  PERSONAL FACTORS: Time since onset of injury/illness/exacerbation are also affecting patient's functional outcome.   REHAB POTENTIAL: Good  CLINICAL DECISION MAKING: Stable/uncomplicated  EVALUATION COMPLEXITY: Low   GOALS: Goals reviewed with patient? Yes  SHORT TERM GOALS: Target date: 06/23/23 Pt will demonstrate indep in HEP to facilitate carry-over of skilled services and improve functional outcomes    Goal Status:   MET  LONG TERM GOALS: Target date: 08/03/23  Pt will increase FOTO to at least 60 in order to demonstrate significant improvement in function related to stair negotiation, ambulation, and ADLs Baseline: 46.31 Goal status:  IN PROGRESS  2.  Pt will demonstrate increase in LE strength to 4+ to facilitate ease and  safety in ambulation Baseline: 3+/5 Goal status:  IN PROGRESS  3.  Patient will demonstrate increase in knee flex ROM to 110-120 degrees to facilitate ease in ambulation and stair negotiation Baseline: 85 degrees Goal status:  MET  4.  Pt will demonstrate improved flexibility in the LEs to mild restriction to facilitate ease in ambulation and ADLs Baseline: moderate restriction Goal status:  IN PROGRESS  5.  Pt will increase by at least 40 ft in order to demonstrate clinically significant improvement in community ambulation Baseline: 491 ft Goal status:  MET    PLAN:  PT FREQUENCY: 2x/week  PT DURATION: 4 weeks  PLANNED INTERVENTIONS: Therapeutic exercises, Therapeutic activity, Neuromuscular re-education, Balance training, Gait training, Patient/Family education, Self Care, Joint mobilization, Dry Needling, Electrical stimulation, Cryotherapy, Moist heat, Taping, and Manual therapy  PLAN FOR NEXT SESSION: Continue POC and may progress as tolerated with emphasis on LE strengthening, mobility, and flexibility activities emphasizing on preventing excessive knee valgus on functional activities.    Tish Frederickson. Kaytelynn Scripter, PT, DPT, OCS Board-Certified Clinical Specialist in Orthopedic PT PT Compact Privilege # ([place]): [phone] T [facility] Ph: [phone]   8:06 AM, 07/22/23

## 2023-07-27 ENCOUNTER — Encounter (HOSPITAL_COMMUNITY): Payer: Self-pay

## 2023-07-27 ENCOUNTER — Ambulatory Visit (HOSPITAL_COMMUNITY): Payer: Medicare Other | Attending: Orthopedic Surgery

## 2023-07-27 DIAGNOSIS — G8929 Other chronic pain: Secondary | ICD-10-CM | POA: Insufficient documentation

## 2023-07-27 DIAGNOSIS — M25561 Pain in right knee: Secondary | ICD-10-CM | POA: Insufficient documentation

## 2023-07-27 NOTE — Therapy (Signed)
OUTPATIENT PHYSICAL THERAPY TREATMENT  Patient Name: Monique [name] MRN: [id number] DOB:02/04/1956, 67 y.o., female Today's Date: 07/27/2023  END OF SESSION:   PT End of Session - 07/27/23 0812     Visit Number 14    Number of Visits 16    Date for PT Re-Evaluation 08/03/23    Authorization Type Medicare (A&B) and [employer]ARP    Progress Note Due on Visit 18    PT Start Time 0720    PT Stop Time 0800    PT Time Calculation (min) 40 min    Activity Tolerance Patient tolerated treatment well    Behavior During Therapy [facility] for tasks assessed/performed              Past Medical History:  Diagnosis Date   Colon cancer screening 03/22/2023   Essential hypertension 02/13/2016   Hyperlipidemia    Hypothyroidism    Long-term current use of benzodiazepine 04/05/2023   Unspecified hypothyroidism 03/02/2013   Past Surgical History:  Procedure Laterality Date   ENDOMETRIAL ABLATION     ROTATOR CUFF REPAIR  20 years ago   WISDOM TOOTH EXTRACTION  09/22/1971   Patient Active Problem List   Diagnosis Date Noted   PTSD (post-traumatic stress disorder) 04/05/2023   Generalized anxiety disorder 04/05/2023   Major depressive disorder, single episode, moderate (HCC) 04/05/2023   Colon cancer screening 03/22/2023   Coronary artery calcification seen on CT scan 10/19/2019   Abnormal screening cardiac CT 11/30/2018   Severe obesity (BMI 35.0-39.9) with comorbidity (HCC) 08/24/2018   Essential hypertension 02/13/2016   Mixed hyperlipidemia 02/13/2016   Primary insomnia 05/25/2014   Hypothyroidism 03/02/2013   Pure hypercholesterolemia 03/02/2013    PCP: Julien Girt, PA-C  REFERRING PROVIDER: Oliver Barre, MD  REFERRING DIAG: M17.11 (ICD-10-CM) - Arthritis of right knee  THERAPY DIAG:  Chronic pain of right knee  Rationale for Evaluation and Treatment: Rehabilitation  ONSET DATE: March 2024  SUBJECTIVE:   SUBJECTIVE STATEMENT: Reports she can tell she is  making improvements.  Main difficulty with going down stairs.  No reports of pain today.    PROGRESS NOTE: 07/06/23: Patient denies having pain but there was one night that she had a rough night around 3 days ago. Knee feels looser now. Patient thinks that PT has helped a lot such that she is around 80% better now. Patient thinks that she needs to continue with PT on a lesser frequency it's because she still feels that the knee still feels weak especially with stairs. Patient states that she's been doing her HEP at home   Evaluation: Arrives to the clinic with c/o R knee pain on occasion (see below). However, patient denies pain at the time of evaluation. Patient states that R quads and R calf feel weak. Condition started March 2024 and gradually gotten worse in time without known cause. Denies any trauma on the knee. Patient took Meloxicam but it did not help. Patient went to see her MD in April 2024 who drained her knee and gave her injections. Procedure helped briefly (2-3 days) but the pain came back. Recently, patient went to see another MD who did X-ray and revealed arthritis. MD gave injections which significantly helped and referred her to outpatient PT evaluation and management.  PERTINENT HISTORY: Anxiety, PTSD PAIN:  Are you having pain? Yes: NPRS scale: 0/10 Pain location: lateral side of R knee, across the joint line Pain description: sharp and aching Aggravating factors: riding horse, negotiating stairs (descending), prolonged sitting Relieving  factors: laying down  PRECAUTIONS: None  RED FLAGS: Bowel or bladder incontinence: No   WEIGHT BEARING RESTRICTIONS: No  FALLS:  Has patient fallen in last 6 months? 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  No acute injuries are noted.  Mild valgus alignment overall.  There are advanced degenerative changes within all 3 compartments.  There are associated osteophytes.  There appears to be some subchondral cysts within the medial aspect of the knee.  No bony lesions.   Impression: Right knee x-rays with moderate to severe degenerative changes  PATIENT SURVEYS:  FOTO 07/06/23: 57.36 from 46.31  COGNITION: Overall cognitive status: Within functional limits for tasks assessed     SENSATION: Not tested  MUSCLE LENGTH: 07/06/23 Hamstrings: moderate restriction on B Gastrocnemius: moderate restriction on B Piriformis: WFL  POSTURE:  Standing: B  genu valgus (R>L), slight pronation R foot  PALPATION: 07/06/23 No tenderness on major bony landmarks of the R knee Hypomobile on R patella towards inferior and medial glide  LOWER EXTREMITY ROM:  Active ROM Right eval Left eval Right 07/06/23  Hip flexion [facility] WFL  Hip extension [facility] WFL  Hip abduction [facility] WFL  Hip adduction     Hip internal rotation     Hip external rotation     Knee flexion 85 WFL 120  Knee extension [facility] WFL  Ankle dorsiflexion [facility] WFL  Ankle plantarflexion [facility] WFL  Ankle inversion     Ankle eversion      (Blank rows = not tested)  LOWER EXTREMITY MMT:  MMT Right eval Left eval Right 07/06/23 Left 07/06/23  Hip flexion 3+ 4- 4 4  Hip extension 3+ 4- 4 4  Hip abduction 3+ 4- 4 4  Hip adduction      Hip internal rotation      Hip external rotation      Knee flexion 4+ 4+ 4+ 4+  Knee extension 3+ 4 4 4   Ankle dorsiflexion 4+ 4+ 4+ 4+  Ankle plantarflexion 4+ 4+ 4+ 4+  Ankle  inversion      Ankle eversion       (Blank rows = not tested)  LOWER EXTREMITY SPECIAL TESTS:  Hip special tests: Ely's test: positive  and Piriformis test: negative Knee special tests: (+) Clark's test and Waldron test on R  FUNCTIONAL TESTS:  5 times sit to stand: 07/06/23: 6.71 sec from 9.50 sec 2 minute walk test: 07/06/23: 576 ft from 491 ft  GAIT: Distance walked: 491 ft Assistive device utilized: None Level of assistance: Complete Independence Comments: done during , no gait deviations/unsteadiness seen  STAIRS: 07/06/23: alternating pattern on ascending/descending, slight R out-toeing on R and knee valgus on R more prominent in descending   TODAY'S TREATMENT:  DATE:  07/27/23: NuStep, seat 9, level 4 x 5' BOSU forward lunges with cueing for knee alignment 2x 10 BOSU forward step up 2 then 1 HHA 2x 10 Double leg press, 3 plates then 4 plates 3" x 2 x 10 Cybex hamstring curls, 4.5 plates, 10 x 2 BOSU upsidedown squats 2x 10 Step down 4in step height 2x 10 Cross over step up 4in 10 R gluteal dips 4in step height x 10 x 2 with B UE support Slant board 3x 30"   07/22/23 NuStep, seat 9, level 4 x 5' Cybex hamstring curls, 4 plates, 10 x 2 Double leg press, 3 plates, 3" x 10 x 2 R leg press, 2 plates, 3" x 10 x 2 Standing R quads stretch with a strap/towel x 30" x 3 Gastrocnemius slant board stretch x 30" x 3 Seated R hamstring stretch x 30" x 3 Fitter wobble board, ant/post, lat/ed, CW, CCW x 10 each with B UE support Fitter wobble board, mini squats, RTB x 3" x 10 x 2 with B UE support Fitter wobble board, R lunges with med pull RTB x 3" x 10 on each with B UE support R gluteal dips x 10 x 2 with B UE support Runner's step ups, 6" box x 10 x 2 on each   07/20/23 NuStep, seat 9, level 4 x 5' Standing R quads stretch with a strap/towel x  30" x 3 Gastrocnemius slant board stretch x 30" x 3 Fitter wobble board, ant/post, lat/ed, CW, CCW x 10 each with B UE support Fitter wobble board, mini squats x 3" x 10 x 2 with B UE support Fitter wobble board, lunges x 3" x 10 on each with B UE support Runner's step ups, 4" box x 10 x 2 on each Stair negotiation (2 rounds on 4" step, 2 rounds, 7" step), no railings TKE, 2 plates x 3" x 10 x 2 on each  07/16/23 Recumbent bike, level 3, seat 8,  x 5' Standing R quads stretch with a strap/towel x 30" x 3 Gastrocnemius slant board stretch x 30" x 3 Fitter wobble board, ant/post, lat/ed, CW, CCW x 10 each with B UE support Fitter wobble board, mini squats x 3" x 10 x 2 with B UE support Fitter wobble board, lunges x 3" x 10 on each with B UE support Step downs (heel taps) on a slant board x 10 x 2 on each R step ups, 7" step x 10 x 2  07/14/23 Bike x 6 minutes, seat 8 level 3 Hamstring curl cybex  4PL x 10 Leg press 4 Pl x 10  Gastroc stretch on step 2X30" each Hamstring stretch 30" x 3 onto 12" step Single leg stance x 30" each no UE assist Vectors 10X3" each with 1 UE assist Mini squat x 15 7" step up x 10 with 1 UE assist, 2 sets  07/08/23 Bike x 6 minutes Hamstring curl cybex  4PL x 10 Leg press 3 Pl x 10  Side step with green theraband x 2 RT Slant board stretch 3 x 30" Hamstring stretch 30" x 3  Single leg stance x 3 B  Mini squat x 15 Sit to stand x 10  6" step up x 10   07/06/23 Progress note (FOTO, , 5TSTS, ROM, MMT) Recumbent bike, level 4, seat 12,  x 5' Standing R quads stretch with a strap/towel x 30" x 3 Gastrocnemius slant board stretch x 30" x 3 Seated R hamstring stretch  x 30" x 3 R Forward step ups x 6" box x 10 x 2 Step downs (heel taps) on half a foam roll x 10 x 2    PATIENT EDUCATION:  Education details: Educated on the pathoanatomy of R knee pain and arthritis. Educated on the goals and course of rehab.  Person educated:  Patient Education method: Explanation Education comprehension: verbalized understanding  HOME EXERCISE PROGRAM: None provided at evaluation  Access Code: [email or web address] URL: [email or web address] 07/02/23 - Seated Piriformis Stretch  - 1-2 x daily - 7 x weekly - 3 reps - 30 hold - ITB Stretch at Wall  - 1-2 x daily - 7 x weekly - 3 reps - 30 hold  06/18/2023 - Seated Calf Stretch with Strap  - 1-2 x daily - 7 x weekly - 3 reps - 30 hold - Standing Quad Stretch with Towel and Arm Support  - 1-2 x daily - 7 x weekly - 3 reps - 30 hold - Seated Hamstring Stretch  - 1-2 x daily - 7 x weekly - 3 reps - 30 hold - Bridge with Hip Abduction and Resistance  - 1-2 x daily - 7 x weekly - 2 sets - 10 reps - 3 hold - Clamshell with Resistance  - 1-2 x daily - 7 x weekly - 2 sets - 10 reps - Mini Squat with Counter Support  - 1-2 x daily - 7 x weekly - 2 sets - 10 reps - 3 hold   Date: 06/14/2023 Prepared by: Emeline Gins Exercises - Supine Bridge  - 2 x daily - 7 x weekly - 1 sets - 10 reps - Supine Active Straight Leg Raise  - 2 x daily - 7 x weekly - 1 sets - 10 reps - Sidelying Hip Abduction  - 2 x daily - 7 x weekly - 1 sets - 10 reps - Seated Long Arc Quad  - 2 x daily - 7 x weekly - 1 sets - 10 reps - 5 sec hold - Seated March  - 2 x daily - 7 x weekly - 1 sets - 10 reps - Standing Hip Abduction with Counter Support  - 2 x daily - 7 x weekly - 1 sets - 10 reps - Standing Hip Extension with Counter Support  - 2 x daily - 7 x weekly - 1 sets - 10 reps - Sit to Stand  - 2 x daily - 7 x weekly - 1 sets - 10 reps   ASSESSMENT:  CLINICAL IMPRESSION:  Session focus with LE strengthening, flexibility and improving proprioception.  Added BOSU as dynamic surface during functional strengthening with cueing for foot and knee alignment for correct execution of activity with good carry over.  No reports of pain.  Did c/o some discomfort during cross over step up that was resolved following  exercise.    PROGRESS NOTE 07/06/23: Patient demonstrated continued improvements in function as indicated by positive significant changes in FOTO, 5TSTS, and . Patient does not report of pain anymore. However, patient still presents with deficits in functional strength. With this, skilled PT is still required to address the impairments and functional limitations listed below. Interventions today were geared towards LE strengthening, and mobility. Tolerated all activities without worsening of symptoms. Reported of slight difficulty with step ups and and step downs due to weakness. Patient demonstrated excellent verbal understanding. Demonstrated appropriate levels of fatigue. Provided slight amount of cueing to ensure correct execution of activity with good carry-over.   OBJECTIVE  IMPAIRMENTS: decreased ROM, decreased strength, hypomobility, impaired flexibility, and pain.   ACTIVITY LIMITATIONS: carrying, lifting, bending, sitting, standing, squatting, and stairs  PARTICIPATION LIMITATIONS: meal prep, cleaning, laundry, driving, community activity, occupation, and yard work  PERSONAL FACTORS: Time since onset of injury/illness/exacerbation are also affecting patient's functional outcome.   REHAB POTENTIAL: Good  CLINICAL DECISION MAKING: Stable/uncomplicated  EVALUATION COMPLEXITY: Low   GOALS: Goals reviewed with patient? Yes  SHORT TERM GOALS: Target date: 06/23/23 Pt will demonstrate indep in HEP to facilitate carry-over of skilled services and improve functional outcomes    Goal Status:   MET  LONG TERM GOALS: Target date: 08/03/23  Pt will increase FOTO to at least 60 in order to demonstrate significant improvement in function related to stair negotiation, ambulation, and ADLs Baseline: 46.31 Goal status:  IN PROGRESS  2.  Pt will demonstrate increase in LE strength to 4+ to facilitate ease and safety in ambulation Baseline: 3+/5 Goal status:  IN PROGRESS  3.  Patient  will demonstrate increase in knee flex ROM to 110-120 degrees to facilitate ease in ambulation and stair negotiation Baseline: 85 degrees Goal status:  MET  4.  Pt will demonstrate improved flexibility in the LEs to mild restriction to facilitate ease in ambulation and ADLs Baseline: moderate restriction Goal status:  IN PROGRESS  5.  Pt will increase by at least 40 ft in order to demonstrate clinically significant improvement in community ambulation Baseline: 491 ft Goal status:  MET    PLAN:  PT FREQUENCY: 2x/week  PT DURATION: 4 weeks  PLANNED INTERVENTIONS: Therapeutic exercises, Therapeutic activity, Neuromuscular re-education, Balance training, Gait training, Patient/Family education, Self Care, Joint mobilization, Dry Needling, Electrical stimulation, Cryotherapy, Moist heat, Taping, and Manual therapy  PLAN FOR NEXT SESSION: Continue POC and may progress as tolerated with emphasis on LE strengthening, mobility, and flexibility activities emphasizing on preventing excessive knee valgus on functional activities.    Becky Sax, LPTA/CLT; CBIS [phone]  Juel Burrow, PTA 07/27/2023, 8:13 AM   8:13 AM, 07/27/23

## 2023-07-29 ENCOUNTER — Ambulatory Visit (HOSPITAL_COMMUNITY): Payer: Medicare Other

## 2023-07-29 DIAGNOSIS — G8929 Other chronic pain: Secondary | ICD-10-CM

## 2023-07-29 DIAGNOSIS — M25561 Pain in right knee: Secondary | ICD-10-CM | POA: Diagnosis not present

## 2023-07-29 NOTE — Therapy (Signed)
OUTPATIENT PHYSICAL THERAPY TREATMENT  Patient Name: Monique [name] MRN: [id number] DOB:03-18-1956, 67 y.o., female Today's Date: 07/29/2023  END OF SESSION:   PT End of Session - 07/29/23 0759     Visit Number 15    Number of Visits 16    Date for PT Re-Evaluation 08/03/23    Authorization Type Medicare (A&B) and AARP    PT Start Time 0800    PT Stop Time 0840    PT Time Calculation (min) 40 min    Activity Tolerance Patient tolerated treatment well    Behavior During Therapy [facility] for tasks assessed/performed              Past Medical History:  Diagnosis Date   Colon cancer screening 03/22/2023   Essential hypertension 02/13/2016   Hyperlipidemia    Hypothyroidism    Long-term current use of benzodiazepine 04/05/2023   Unspecified hypothyroidism 03/02/2013   Past Surgical History:  Procedure Laterality Date   ENDOMETRIAL ABLATION     ROTATOR CUFF REPAIR  20 years ago   WISDOM TOOTH EXTRACTION  09/22/1971   Patient Active Problem List   Diagnosis Date Noted   PTSD (post-traumatic stress disorder) 04/05/2023   Generalized anxiety disorder 04/05/2023   Major depressive disorder, single episode, moderate (HCC) 04/05/2023   Colon cancer screening 03/22/2023   Coronary artery calcification seen on CT scan 10/19/2019   Abnormal screening cardiac CT 11/30/2018   Severe obesity (BMI 35.0-39.9) with comorbidity (HCC) 08/24/2018   Essential hypertension 02/13/2016   Mixed hyperlipidemia 02/13/2016   Primary insomnia 05/25/2014   Hypothyroidism 03/02/2013   Pure hypercholesterolemia 03/02/2013    PCP: Julien Girt, PA-C  REFERRING PROVIDER: Oliver Barre, MD  REFERRING DIAG: M17.11 (ICD-10-CM) - Arthritis of right knee  THERAPY DIAG:  Chronic pain of right knee  Rationale for Evaluation and Treatment: Rehabilitation  ONSET DATE: March 2024  SUBJECTIVE:   SUBJECTIVE STATEMENT: Patient denies any pain today. Patient states that stair  negotiation is getting better but it gets worse at the end of the day.   PROGRESS NOTE: 07/06/23: Patient denies having pain but there was one night that she had a rough night around 3 days ago. Knee feels looser now. Patient thinks that PT has helped a lot such that she is around 80% better now. Patient thinks that she needs to continue with PT on a lesser frequency it's because she still feels that the knee still feels weak especially with stairs. Patient states that she's been doing her HEP at home   Evaluation: Arrives to the clinic with c/o R knee pain on occasion (see below). However, patient denies pain at the time of evaluation. Patient states that R quads and R calf feel weak. Condition started March 2024 and gradually gotten worse in time without known cause. Denies any trauma on the knee. Patient took Meloxicam but it did not help. Patient went to see her MD in April 2024 who drained her knee and gave her injections. Procedure helped briefly (2-3 days) but the pain came back. Recently, patient went to see another MD who did X-ray and revealed arthritis. MD gave injections which significantly helped and referred her to outpatient PT evaluation and management.  PERTINENT HISTORY: Anxiety, PTSD PAIN:  Are you having pain? Yes: NPRS scale: 0/10 Pain location: lateral side of R knee, across the joint line Pain description: sharp and aching Aggravating factors: riding horse, negotiating stairs (descending), prolonged sitting Relieving factors: laying down  PRECAUTIONS: None  RED  FLAGS: Bowel or bladder incontinence: No   WEIGHT BEARING RESTRICTIONS: No  FALLS:  Has patient fallen in last 6 months? 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  No acute injuries are noted.  Mild valgus alignment overall.  There are advanced degenerative changes within all 3 compartments.  There are associated osteophytes.  There appears to be some subchondral cysts within the medial aspect of the knee.  No bony lesions.   Impression: Right knee x-rays with moderate to severe degenerative changes  PATIENT SURVEYS:  FOTO 07/06/23: 57.36 from 46.31  COGNITION: Overall cognitive status: Within functional limits for tasks assessed     SENSATION: Not tested  MUSCLE LENGTH: 07/06/23 Hamstrings: moderate restriction on B Gastrocnemius: moderate restriction on B Piriformis: WFL  POSTURE:  Standing: B  genu valgus (R>L), slight pronation R foot  PALPATION: 07/06/23 No tenderness on major bony landmarks of the R knee Hypomobile on R patella towards inferior and medial glide  LOWER EXTREMITY ROM:  Active ROM Right eval Left eval Right 07/06/23  Hip flexion [facility] WFL  Hip extension [facility] WFL  Hip abduction [facility] WFL  Hip adduction     Hip internal rotation     Hip external rotation     Knee flexion 85 WFL 120  Knee extension [facility] WFL  Ankle dorsiflexion [facility] WFL  Ankle plantarflexion [facility] WFL  Ankle inversion     Ankle eversion      (Blank rows = not tested)  LOWER EXTREMITY MMT:  MMT Right eval Left eval Right 07/06/23 Left 07/06/23  Hip flexion 3+ 4- 4 4  Hip extension 3+ 4- 4 4  Hip abduction 3+ 4- 4 4  Hip adduction      Hip internal rotation      Hip external rotation      Knee flexion 4+ 4+ 4+ 4+  Knee extension 3+ 4 4 4   Ankle dorsiflexion 4+ 4+ 4+ 4+  Ankle plantarflexion 4+ 4+ 4+ 4+  Ankle inversion      Ankle  eversion       (Blank rows = not tested)  LOWER EXTREMITY SPECIAL TESTS:  Hip special tests: Ely's test: positive  and Piriformis test: negative Knee special tests: (+) Clark's test and Waldron test on R  FUNCTIONAL TESTS:  5 times sit to stand: 07/06/23: 6.71 sec from 9.50 sec 2 minute walk test: 07/06/23: 576 ft from 491 ft  GAIT: Distance walked: 491 ft Assistive device utilized: None Level of assistance: Complete Independence Comments: done during , no gait deviations/unsteadiness seen  STAIRS: 07/06/23: alternating pattern on ascending/descending, slight R out-toeing on R and knee valgus on R more prominent in descending   TODAY'S TREATMENT:  DATE:  07/29/23 NuStep, seat 9, level 5 x 5' Cybex hamstring curls, 4.5 plates, 10 x 2 Double leg press, 4 plates, 3" x 10 x 2 R leg press, 3 plates, 3" x 10 x 2 Standing R quads stretch with a strap/towel x 30" x 3 Gastrocnemius slant board stretch x 30" x 3 Seated R hamstring stretch x 30" x 3 Gr 2 patellar mob on all planes x 1' Flat BOSU, mini squats, RTB x 3" x 10 x 2 with B UE support Flat BOSU, R lunges with med pull RTB x 3" x 10 on each with B UE support Step downs on a slant board x 10 x 2 on each R gluteal dips x 10 x 2 with B UE support Runner's step ups, 7" step x 10 x 2 on each  07/27/23: NuStep, seat 9, level 4 x 5' BOSU forward lunges with cueing for knee alignment 2x 10 BOSU forward step up 2 then 1 HHA 2x 10 Double leg press, 3 plates then 4 plates 3" x 2 x 10 Cybex hamstring curls, 4.5 plates, 10 x 2 BOSU upsidedown squats 2x 10 Step down 4in step height 2x 10 Cross over step up 4in 10 R gluteal dips 4in step height x 10 x 2 with B UE support Slant board 3x 30"   07/22/23 NuStep, seat 9, level 4 x 5' Cybex hamstring curls, 4 plates, 10 x 2 Double leg press, 3 plates, 3" x 10 x  2 R leg press, 2 plates, 3" x 10 x 2 Standing R quads stretch with a strap/towel x 30" x 3 Gastrocnemius slant board stretch x 30" x 3 Seated R hamstring stretch x 30" x 3 Fitter wobble board, ant/post, lat/ed, CW, CCW x 10 each with B UE support Fitter wobble board, mini squats, RTB x 3" x 10 x 2 with B UE support Fitter wobble board, R lunges with med pull RTB x 3" x 10 on each with B UE support R gluteal dips x 10 x 2 with B UE support Runner's step ups, 6" box x 10 x 2 on each   07/20/23 NuStep, seat 9, level 4 x 5' Standing R quads stretch with a strap/towel x 30" x 3 Gastrocnemius slant board stretch x 30" x 3 Fitter wobble board, ant/post, lat/ed, CW, CCW x 10 each with B UE support Fitter wobble board, mini squats x 3" x 10 x 2 with B UE support Fitter wobble board, lunges x 3" x 10 on each with B UE support Runner's step ups, 4" box x 10 x 2 on each Stair negotiation (2 rounds on 4" step, 2 rounds, 7" step), no railings TKE, 2 plates x 3" x 10 x 2 on each  07/16/23 Recumbent bike, level 3, seat 8,  x 5' Standing R quads stretch with a strap/towel x 30" x 3 Gastrocnemius slant board stretch x 30" x 3 Fitter wobble board, ant/post, lat/ed, CW, CCW x 10 each with B UE support Fitter wobble board, mini squats x 3" x 10 x 2 with B UE support Fitter wobble board, lunges x 3" x 10 on each with B UE support Step downs (heel taps) on a slant board x 10 x 2 on each R step ups, 7" step x 10 x 2  07/14/23 Bike x 6 minutes, seat 8 level 3 Hamstring curl cybex  4PL x 10 Leg press 4 Pl x 10  Gastroc stretch on step 2X30" each Hamstring stretch 30"  x 3 onto 12" step Single leg stance x 30" each no UE assist Vectors 10X3" each with 1 UE assist Mini squat x 15 7" step up x 10 with 1 UE assist, 2 sets  07/08/23 Bike x 6 minutes Hamstring curl cybex  4PL x 10 Leg press 3 Pl x 10  Side step with green theraband x 2 RT Slant board stretch 3 x 30" Hamstring stretch 30" x 3   Single leg stance x 3 B  Mini squat x 15 Sit to stand x 10  6" step up x 10   07/06/23 Progress note (FOTO, , 5TSTS, ROM, MMT) Recumbent bike, level 4, seat 12,  x 5' Standing R quads stretch with a strap/towel x 30" x 3 Gastrocnemius slant board stretch x 30" x 3 Seated R hamstring stretch x 30" x 3 R Forward step ups x 6" box x 10 x 2 Step downs (heel taps) on half a foam roll x 10 x 2    PATIENT EDUCATION:  Education details: Educated on the pathoanatomy of R knee pain and arthritis. Educated on the goals and course of rehab.  Person educated: Patient Education method: Explanation Education comprehension: verbalized understanding  HOME EXERCISE PROGRAM: None provided at evaluation  Access Code: [email or web address] URL: [email or web address] 07/02/23 - Seated Piriformis Stretch  - 1-2 x daily - 7 x weekly - 3 reps - 30 hold - ITB Stretch at Wall  - 1-2 x daily - 7 x weekly - 3 reps - 30 hold  06/18/2023 - Seated Calf Stretch with Strap  - 1-2 x daily - 7 x weekly - 3 reps - 30 hold - Standing Quad Stretch with Towel and Arm Support  - 1-2 x daily - 7 x weekly - 3 reps - 30 hold - Seated Hamstring Stretch  - 1-2 x daily - 7 x weekly - 3 reps - 30 hold - Bridge with Hip Abduction and Resistance  - 1-2 x daily - 7 x weekly - 2 sets - 10 reps - 3 hold - Clamshell with Resistance  - 1-2 x daily - 7 x weekly - 2 sets - 10 reps - Mini Squat with Counter Support  - 1-2 x daily - 7 x weekly - 2 sets - 10 reps - 3 hold   Date: 06/14/2023 Prepared by: Emeline Gins Exercises - Supine Bridge  - 2 x daily - 7 x weekly - 1 sets - 10 reps - Supine Active Straight Leg Raise  - 2 x daily - 7 x weekly - 1 sets - 10 reps - Sidelying Hip Abduction  - 2 x daily - 7 x weekly - 1 sets - 10 reps - Seated Long Arc Quad  - 2 x daily - 7 x weekly - 1 sets - 10 reps - 5 sec hold - Seated March  - 2 x daily - 7 x weekly - 1 sets - 10 reps - Standing Hip Abduction with Counter Support  -  2 x daily - 7 x weekly - 1 sets - 10 reps - Standing Hip Extension with Counter Support  - 2 x daily - 7 x weekly - 1 sets - 10 reps - Sit to Stand  - 2 x daily - 7 x weekly - 1 sets - 10 reps   ASSESSMENT:  CLINICAL IMPRESSION:  Interventions today were geared towards LE strengthening, flexibility, and proprioception. Tolerated all activities without worsening of symptoms. Demonstrated appropriate levels of fatigue. Provided slight amount of  cueing to ensure correct execution of activity with good carry-over. To date, skilled PT is required to address the impairments and improve function.    PROGRESS NOTE 07/06/23: Patient demonstrated continued improvements in function as indicated by positive significant changes in FOTO, 5TSTS, and . Patient does not report of pain anymore. However, patient still presents with deficits in functional strength. With this, skilled PT is still required to address the impairments and functional limitations listed below. Interventions today were geared towards LE strengthening, and mobility. Tolerated all activities without worsening of symptoms. Reported of slight difficulty with step ups and and step downs due to weakness. Patient demonstrated excellent verbal understanding. Demonstrated appropriate levels of fatigue. Provided slight amount of cueing to ensure correct execution of activity with good carry-over.   OBJECTIVE IMPAIRMENTS: decreased ROM, decreased strength, hypomobility, impaired flexibility, and pain.   ACTIVITY LIMITATIONS: carrying, lifting, bending, sitting, standing, squatting, and stairs  PARTICIPATION LIMITATIONS: meal prep, cleaning, laundry, driving, community activity, occupation, and yard work  PERSONAL FACTORS: Time since onset of injury/illness/exacerbation are also affecting patient's functional outcome.   REHAB POTENTIAL: Good  CLINICAL DECISION MAKING: Stable/uncomplicated  EVALUATION COMPLEXITY: Low   GOALS: Goals  reviewed with patient? Yes  SHORT TERM GOALS: Target date: 06/23/23 Pt will demonstrate indep in HEP to facilitate carry-over of skilled services and improve functional outcomes    Goal Status:   MET  LONG TERM GOALS: Target date: 08/03/23  Pt will increase FOTO to at least 60 in order to demonstrate significant improvement in function related to stair negotiation, ambulation, and ADLs Baseline: 46.31 Goal status:  IN PROGRESS  2.  Pt will demonstrate increase in LE strength to 4+ to facilitate ease and safety in ambulation Baseline: 3+/5 Goal status:  IN PROGRESS  3.  Patient will demonstrate increase in knee flex ROM to 110-120 degrees to facilitate ease in ambulation and stair negotiation Baseline: 85 degrees Goal status:  MET  4.  Pt will demonstrate improved flexibility in the LEs to mild restriction to facilitate ease in ambulation and ADLs Baseline: moderate restriction Goal status:  IN PROGRESS  5.  Pt will increase by at least 40 ft in order to demonstrate clinically significant improvement in community ambulation Baseline: 491 ft Goal status:  MET    PLAN:  PT FREQUENCY: 2x/week  PT DURATION: 4 weeks  PLANNED INTERVENTIONS: Therapeutic exercises, Therapeutic activity, Neuromuscular re-education, Balance training, Gait training, Patient/Family education, Self Care, Joint mobilization, Dry Needling, Electrical stimulation, Cryotherapy, Moist heat, Taping, and Manual therapy  PLAN FOR NEXT SESSION: Continue POC and may progress as tolerated with emphasis on LE strengthening, mobility, and flexibility activities emphasizing on preventing excessive knee valgus on functional activities.    Tish Frederickson. Marshall Kampf, PT, DPT, OCS Board-Certified Clinical Specialist in Orthopedic PT PT Compact Privilege # ([place]): [phone] [id number]  07/29/2023, 8:06 AM   8:06 AM, 07/29/23

## 2023-08-03 ENCOUNTER — Ambulatory Visit (HOSPITAL_COMMUNITY): Payer: Medicare Other

## 2023-08-03 DIAGNOSIS — M25561 Pain in right knee: Secondary | ICD-10-CM | POA: Diagnosis not present

## 2023-08-03 DIAGNOSIS — G8929 Other chronic pain: Secondary | ICD-10-CM

## 2023-08-03 NOTE — Therapy (Signed)
OUTPATIENT PHYSICAL THERAPY PROGRESS/DISCHARGE NOTE  Patient Name: Monique [name] MRN: [id number] DOB:12-Nov-1955, 67 y.o., female Today's Date: 08/03/2023  END OF SESSION:   PT End of Session - 08/03/23 0927     Visit Number 16    Number of Visits 16    Date for PT Re-Evaluation 08/03/23    Authorization Type Medicare (A&B) and [employer]ARP    PT Start Time [id number]    PT Stop Time 0930    PT Time Calculation (min) 40 min    Activity Tolerance Patient tolerated treatment well    Behavior During Therapy [facility] for tasks assessed/performed            Past Medical History:  Diagnosis Date   Colon cancer screening 03/22/2023   Essential hypertension 02/13/2016   Hyperlipidemia    Hypothyroidism    Long-term current use of benzodiazepine 04/05/2023   Unspecified hypothyroidism 03/02/2013   Past Surgical History:  Procedure Laterality Date   ENDOMETRIAL ABLATION     ROTATOR CUFF REPAIR  20 years ago   WISDOM TOOTH EXTRACTION  09/22/1971   Patient Active Problem List   Diagnosis Date Noted   PTSD (post-traumatic stress disorder) 04/05/2023   Generalized anxiety disorder 04/05/2023   Major depressive disorder, single episode, moderate (HCC) 04/05/2023   Colon cancer screening 03/22/2023   Coronary artery calcification seen on CT scan 10/19/2019   Abnormal screening cardiac CT 11/30/2018   Severe obesity (BMI 35.0-39.9) with comorbidity (HCC) 08/24/2018   Essential hypertension 02/13/2016   Mixed hyperlipidemia 02/13/2016   Primary insomnia 05/25/2014   Hypothyroidism 03/02/2013   Pure hypercholesterolemia 03/02/2013    Progress Note Reporting Period 07/06/23 to 08/03/23  See note below for Objective Data and Assessment of Progress/Goals.    PCP: Julien Girt, PA-C  REFERRING PROVIDER: Oliver Barre, MD  REFERRING DIAG: M17.11 (ICD-10-CM) - Arthritis of right knee  THERAPY DIAG:  Chronic pain of right knee  Rationale for Evaluation and Treatment:  Rehabilitation  ONSET DATE: March 2024  SUBJECTIVE:   SUBJECTIVE STATEMENT: PROGRESS NOTE 08/03/23: Patient denies any pain today. Patient states that stair negotiation is doing better but around 7th-8th round of stair negotiation (1 flight is 8 steps), patient has to hold on to the railing because it hurts (3/10 pain on the knee) and the leg feels weak. Patient thinks that she made a lot of progress with stair negotiation (around 80% improvement). Patient thinks that she needs to continue with PT because she feels that she will "backslide" if she stops.  PROGRESS NOTE: 07/06/23: Patient denies having pain but there was one night that she had a rough night around 3 days ago. Knee feels looser now. Patient thinks that PT has helped a lot such that she is around 80% better now. Patient thinks that she needs to continue with PT on a lesser frequency it's because she still feels that the knee still feels weak especially with stairs. Patient states that she's been doing her HEP at home   Evaluation: Arrives to the clinic with c/o R knee pain on occasion (see below). However, patient denies pain at the time of evaluation. Patient states that R quads and R calf feel weak. Condition started March 2024 and gradually gotten worse in time without known cause. Denies any trauma on the knee. Patient took Meloxicam but it did not help. Patient went to see her MD in April 2024 who drained her knee and gave her injections. Procedure helped briefly (2-3 days) but the pain  came back. Recently, patient went to see another MD who did X-ray and revealed arthritis. MD gave injections which significantly helped and referred her to outpatient PT evaluation and management.  PERTINENT HISTORY: Anxiety, PTSD PAIN:  Are you having pain? Yes: NPRS scale: 0/10 Pain location: lateral side of R knee, across the joint line Pain description: sharp and aching Aggravating factors: riding horse, negotiating stairs (descending),  prolonged sitting Relieving factors: laying down  PRECAUTIONS: None  RED FLAGS: Bowel or bladder incontinence: No   WEIGHT BEARING RESTRICTIONS: No  FALLS:  Has patient fallen in last 6 months? 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  No acute injuries are noted.  Mild valgus alignment overall.  There are advanced degenerative changes within all 3 compartments.  There are associated osteophytes.  There appears to be some subchondral cysts within the medial aspect of the knee.  No bony lesions.   Impression: Right knee x-rays with moderate to severe degenerative changes  PATIENT SURVEYS:  FOTO 08/03/23 66.29 from 07/06/23: 57.36 from 46.31  COGNITION: Overall cognitive status: Within functional limits for tasks assessed     SENSATION: Not tested  MUSCLE LENGTH: 08/03/23 Hamstrings: mild restriction on B Gastrocnemius: mild restriction on B Piriformis: WFL  POSTURE:  Standing: B  genu valgus (R>L), slight pronation R foot  PALPATION:  08/03/23 No tenderness on major bony landmarks of the R knee Slight hypomobility on R patella towards inferior and medial glide  LOWER EXTREMITY ROM:  Active ROM Right eval Left eval Right 07/06/23 Right 08/03/23  Hip flexion [facility] WFL  Hip extension [facility] WFL  Hip abduction [facility] WFL  Hip adduction      Hip internal rotation      Hip external rotation      Knee flexion 85 WFL 120 120  Knee  extension [facility] WFL  Ankle dorsiflexion [facility] WFL  Ankle plantarflexion [facility] WFL  Ankle inversion      Ankle eversion       (Blank rows = not tested)  LOWER EXTREMITY MMT:  MMT Right eval Left eval Right 07/06/23 Left 07/06/23 Right 08/03/23 Left 08/03/23  Hip flexion 3+ 4- 4 4 4+ 4+  Hip extension 3+ 4- 4 4 4+ 4+  Hip abduction 3+ 4- 4 4 4+ 4+  Hip adduction        Hip internal rotation        Hip external rotation        Knee flexion 4+ 4+ 4+ 4+ 4+ 4+  Knee extension 3+ 4 4 4  4+ 4+  Ankle dorsiflexion 4+ 4+ 4+ 4+ 4+ 4+  Ankle plantarflexion 4+ 4+ 4+ 4+ 4+ 4+  Ankle inversion        Ankle eversion         (Blank rows = not tested)  LOWER EXTREMITY SPECIAL TESTS:  Hip special tests: Ely's test: positive  and Piriformis test: negative Knee special tests: (+)  Clark's test and Waldron test on R  FUNCTIONAL TESTS:  5 times sit to stand: 07/06/23: 6.71 sec from 9.50 sec 2 minute walk test: 08/03/23: 583 ft from 07/06/23: 576 ft from 491 ft  GAIT: Distance walked: 491 ft Assistive device utilized: None Level of assistance: Complete Independence Comments: done during , no gait deviations/unsteadiness seen  STAIRS: 08/03/23: alternating pattern on ascending/descending, little to no out-toeing on R and knee valgus on R on descending. Did not c/o pain. No unsteadiness seen.   TODAY'S TREATMENT:                                                                                                                              DATE:  08/03/23 Progress note (FOTO, , ROM, MMT, muscle length, stairs) Hip vectors, GTB x 10 x 2 Standing R quads stretch x 30" x 3 Seated R calf stretch x 30" x 3 Reviewed HEP  07/29/23 NuStep, seat 9, level 5 x 5' Cybex hamstring curls, 4.5 plates, 10 x 2 Double leg press, 4 plates, 3" x 10 x 2 R leg press, 3 plates, 3" x 10 x 2 Standing R quads stretch with a strap/towel x 30" x 3 Gastrocnemius slant board stretch x 30" x  3 Seated R hamstring stretch x 30" x 3 Gr 2 patellar mob on all planes x 1' Flat BOSU, mini squats, RTB x 3" x 10 x 2 with B UE support Flat BOSU, R lunges with med pull RTB x 3" x 10 on each with B UE support Step downs on a slant board x 10 x 2 on each R gluteal dips x 10 x 2 with B UE support Runner's step ups, 7" step x 10 x 2 on each  07/27/23: NuStep, seat 9, level 4 x 5' BOSU forward lunges with cueing for knee alignment 2x 10 BOSU forward step up 2 then 1 HHA 2x 10 Double leg press, 3 plates then 4 plates 3" x 2 x 10 Cybex hamstring curls, 4.5 plates, 10 x 2 BOSU upsidedown squats 2x 10 Step down 4in step height 2x 10 Cross over step up 4in 10 R gluteal dips 4in step height x 10 x 2 with B UE support Slant board 3x 30"   07/22/23 NuStep, seat 9, level 4 x 5' Cybex hamstring curls, 4 plates, 10 x 2 Double leg press, 3 plates, 3" x 10 x 2 R leg press, 2 plates, 3" x 10 x 2 Standing R quads stretch with a strap/towel x 30" x 3 Gastrocnemius slant board stretch x 30" x 3 Seated R hamstring stretch x 30" x 3 Fitter wobble board, ant/post, lat/ed, CW, CCW x 10 each with B UE support Fitter wobble board, mini squats, RTB x 3" x 10 x 2 with B UE support Fitter wobble board, R lunges with med pull RTB x 3" x 10 on each with B UE support R gluteal dips x 10 x 2  with B UE support Runner's step ups, 6" box x 10 x 2 on each   07/20/23 NuStep, seat 9, level 4 x 5' Standing R quads stretch with a strap/towel x 30" x 3 Gastrocnemius slant board stretch x 30" x 3 Fitter wobble board, ant/post, lat/ed, CW, CCW x 10 each with B UE support Fitter wobble board, mini squats x 3" x 10 x 2 with B UE support Fitter wobble board, lunges x 3" x 10 on each with B UE support Runner's step ups, 4" box x 10 x 2 on each Stair negotiation (2 rounds on 4" step, 2 rounds, 7" step), no railings TKE, 2 plates x 3" x 10 x 2 on each  07/16/23 Recumbent bike, level 3, seat 8,  x 5' Standing R quads  stretch with a strap/towel x 30" x 3 Gastrocnemius slant board stretch x 30" x 3 Fitter wobble board, ant/post, lat/ed, CW, CCW x 10 each with B UE support Fitter wobble board, mini squats x 3" x 10 x 2 with B UE support Fitter wobble board, lunges x 3" x 10 on each with B UE support Step downs (heel taps) on a slant board x 10 x 2 on each R step ups, 7" step x 10 x 2  07/14/23 Bike x 6 minutes, seat 8 level 3 Hamstring curl cybex  4PL x 10 Leg press 4 Pl x 10  Gastroc stretch on step 2X30" each Hamstring stretch 30" x 3 onto 12" step Single leg stance x 30" each no UE assist Vectors 10X3" each with 1 UE assist Mini squat x 15 7" step up x 10 with 1 UE assist, 2 sets  07/08/23 Bike x 6 minutes Hamstring curl cybex  4PL x 10 Leg press 3 Pl x 10  Side step with green theraband x 2 RT Slant board stretch 3 x 30" Hamstring stretch 30" x 3  Single leg stance x 3 B  Mini squat x 15 Sit to stand x 10  6" step up x 10   07/06/23 Progress note (FOTO, , 5TSTS, ROM, MMT) Recumbent bike, level 4, seat 12,  x 5' Standing R quads stretch with a strap/towel x 30" x 3 Gastrocnemius slant board stretch x 30" x 3 Seated R hamstring stretch x 30" x 3 R Forward step ups x 6" box x 10 x 2 Step downs (heel taps) on half a foam roll x 10 x 2    PATIENT EDUCATION:  Education details: Educated on the pathoanatomy of R knee pain and arthritis. Educated on the goals and course of rehab.  Person educated: Patient Education method: Explanation Education comprehension: verbalized understanding  HOME EXERCISE PROGRAM: None provided at evaluation  Access Code: [email or web address] URL: [email or web address] 08/03/23 - exercises changed to 5x/week - removed bridges, hip abd and hip ext in standing without resistance - Standing 3-Way Leg Reach with Resistance at Ankles and Counter Support  - 1-2 x daily - 5 x weekly - 2 sets - 10 reps  07/02/23 - Seated Piriformis Stretch  - 1-2 x  daily - 7 x weekly - 3 reps - 30 hold - ITB Stretch at Wall  - 1-2 x daily - 7 x weekly - 3 reps - 30 hold  06/18/2023 - Seated Calf Stretch with Strap  - 1-2 x daily - 7 x weekly - 3 reps - 30 hold - Standing Quad Stretch with Towel and Arm Support  - 1-2 x daily - 7 x weekly -  3 reps - 30 hold - Seated Hamstring Stretch  - 1-2 x daily - 7 x weekly - 3 reps - 30 hold - Bridge with Hip Abduction and Resistance  - 1-2 x daily - 7 x weekly - 2 sets - 10 reps - 3 hold - Clamshell with Resistance  - 1-2 x daily - 7 x weekly - 2 sets - 10 reps - Mini Squat with Counter Support  - 1-2 x daily - 7 x weekly - 2 sets - 10 reps - 3 hold   Date: 06/14/2023 Prepared by: Emeline Gins Exercises - Supine Bridge  - 2 x daily - 7 x weekly - 1 sets - 10 reps - Supine Active Straight Leg Raise  - 2 x daily - 7 x weekly - 1 sets - 10 reps - Sidelying Hip Abduction  - 2 x daily - 7 x weekly - 1 sets - 10 reps - Seated Long Arc Quad  - 2 x daily - 7 x weekly - 1 sets - 10 reps - 5 sec hold - Seated March  - 2 x daily - 7 x weekly - 1 sets - 10 reps - Standing Hip Abduction with Counter Support  - 2 x daily - 7 x weekly - 1 sets - 10 reps - Standing Hip Extension with Counter Support  - 2 x daily - 7 x weekly - 1 sets - 10 reps - Sit to Stand  - 2 x daily - 7 x weekly - 1 sets - 10 reps   ASSESSMENT:  CLINICAL IMPRESSION:  PROGRESS NOTE 08/03/23:Patient demonstrated continued improvements in function as indicated by positive significant changes in FOTO, flexibility, and strength. Did not c/o difficulty or pain with stair negotiation. Patient has reached her max potential for . With these reasons, skilled PT is not required at this time and patient may just continue her HEP to facilitate carry-over of the gains in rehab. Patient may keep doing her HEP for 5x/week for a month and start to taper on the succeeding months. Patient gave excellent verbal understanding. Reviewed HEP and patient states that she had  some difficulty with the bridges so it was removed. Standing hip exercises were then given resistance to maintain hip strength and was included in her HEP (see above). Tolerated all activities without worsening of symptoms. Demonstrated appropriate levels of fatigue. Provided slight amount of cueing to ensure correct execution of activity with good carry-over.   PROGRESS NOTE 07/06/23: Patient demonstrated continued improvements in function as indicated by positive significant changes in FOTO, 5TSTS, and . Patient does not report of pain anymore. However, patient still presents with deficits in functional strength. With this, skilled PT is still required to address the impairments and functional limitations listed below. Interventions today were geared towards LE strengthening, and mobility. Tolerated all activities without worsening of symptoms. Reported of slight difficulty with step ups and and step downs due to weakness. Patient demonstrated excellent verbal understanding. Demonstrated appropriate levels of fatigue. Provided slight amount of cueing to ensure correct execution of activity with good carry-over.   OBJECTIVE IMPAIRMENTS: hypomobility.   ACTIVITY LIMITATIONS:  none reported  PARTICIPATION LIMITATIONS: occupation and yard work  PERSONAL FACTORS: Time since onset of injury/illness/exacerbation are also affecting patient's functional outcome.   REHAB POTENTIAL: Good   GOALS: Goals reviewed with patient? Yes  SHORT TERM GOALS: Target date: 06/23/23 Pt will demonstrate indep in HEP to facilitate carry-over of skilled services and improve functional outcomes  Goal Status:   MET  LONG TERM GOALS: Target date: 08/03/23  Pt will increase FOTO to at least 60 in order to demonstrate significant improvement in function related to stair negotiation, ambulation, and ADLs Baseline: 46.31 08/03/23: 66.29 Goal status: MET   2.  Pt will demonstrate increase in LE strength to 4+ to  facilitate ease and safety in ambulation Baseline: 3+/5 08/03/23: 4+/5 Goal status:  IN PROGRESS  3.  Patient will demonstrate increase in knee flex ROM to 110-120 degrees to facilitate ease in ambulation and stair negotiation Baseline: 85 degrees Goal status:  MET  4.  Pt will demonstrate improved flexibility in the LEs to mild restriction to facilitate ease in ambulation and ADLs Baseline: moderate restriction 08/03/23: mild restriction Goal status:  MET  5.  Pt will increase by at least 40 ft in order to demonstrate clinically significant improvement in community ambulation Baseline: 491 ft Goal status:  MET    PLAN:  PT FREQUENCY:  0  PT DURATION: other: 0  PLANNED INTERVENTIONS:  D/C from skilled PT. D/C to independent HEP  PHYSICAL THERAPY DISCHARGE SUMMARY  Visits from Start of Care: 16  Current functional level related to goals / functional outcomes: See above   Remaining deficits: See above   Education / Equipment: See above   Patient agrees to discharge. Patient goals were met. Patient is being discharged due to meeting the stated rehab goals.   Tish Frederickson. Emely Fahy, PT, DPT, OCS Board-Certified Clinical Specialist in Orthopedic PT PT Compact Privilege # ([place]): [id number] T 08/03/2023, 9:39 AM

## 2023-08-09 ENCOUNTER — Telehealth (INDEPENDENT_AMBULATORY_CARE_PROVIDER_SITE_OTHER): Payer: Medicare Other | Admitting: Psychiatry

## 2023-08-09 ENCOUNTER — Other Ambulatory Visit: Payer: Self-pay | Admitting: Physician Assistant

## 2023-08-09 ENCOUNTER — Telehealth (HOSPITAL_COMMUNITY): Payer: Medicare Other | Admitting: Psychiatry

## 2023-08-09 ENCOUNTER — Encounter (HOSPITAL_COMMUNITY): Payer: Self-pay | Admitting: Psychiatry

## 2023-08-09 DIAGNOSIS — F321 Major depressive disorder, single episode, moderate: Secondary | ICD-10-CM

## 2023-08-09 DIAGNOSIS — F5101 Primary insomnia: Secondary | ICD-10-CM | POA: Diagnosis not present

## 2023-08-09 DIAGNOSIS — F411 Generalized anxiety disorder: Secondary | ICD-10-CM | POA: Diagnosis not present

## 2023-08-09 DIAGNOSIS — F4310 Post-traumatic stress disorder, unspecified: Secondary | ICD-10-CM | POA: Diagnosis not present

## 2023-08-09 DIAGNOSIS — Z1231 Encounter for screening mammogram for malignant neoplasm of breast: Secondary | ICD-10-CM

## 2023-08-09 NOTE — Progress Notes (Signed)
BH MD Outpatient Progress Note  08/09/2023 9:37 AM Monique [name]  MRN:  [id number]  Assessment:  Monique [name] presents for follow-up evaluation. Today, 08/09/23, patient reports ongoing stability of mood with benefit of sleeping more regularly with increased dose of trazodone.  Did ultimately go back to work for increased hours but is handling the stress well.  With upcoming reminders of her grandson's death but the holiday season is anticipating a tougher time with that.  Engaging in PT for her knee which has been helpful for mobility.  She was able to see her PCP and only blood work abnormality was thyroid which led to adjustment in Synthroid and did get a nutrition referral.  She is overall satisfied with medication regimen so we will keep Wellbutrin and trazodone the same for now. Still not interested in psychotherapy at this time.  Follow-up in 5 weeks.  For safety, her acute risk factors for suicide are: living alone, current diagnosis of depression and PTSD. Her chronic risk factors for suicide are: access to firearms, older age, living alone, chronic prescription benzodiazepine use. Her protective factors are: beloved pets in the home, supportive family, employment, actively seeking and engaging with mental health care, firearms are secured when not in use, no suicidal ideation in session. While future events cannot be fully predicted she does not currently meet IVC criteria and can be continued as an outpatient.  Identifying Information: Monique [name] is a 67 y.o. female with a history of primary insomnia, history of long-term use of prescription benzodiazepine, generalized anxiety disorder, PTSD with single episode of major depressive disorder after her grandson's death in Feb 16, 2024hypothyroidism who is an established patient with [facility] participating in follow-up via video conferencing. Initial evaluation of depression, insomnia, anxiety on  04/05/23; please see that note for full case formulation.  Patient reported decades of primary insomnia with previous trials of Ambien and Xanax collateral of which was present for 10 to 12 years.  Insomnia at time of initial appointment was multifactorial with taking Wellbutrin that had been started in May 2024 at nighttime along with the chronic Xanax use.  This was in addition to the single episode of major depressive disorder which was the result of the sudden unexpected death of her grandson from a motor vehicle collision in 06-Nov-2022.  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  Outside of this and prior to the traumatic event she had generalized anxiety disorder for many years.  Panic attacks occurred every 3 to 4 years.  She was amenable to taper of Xanax we will plan to discontinue after 1 month decreased dose.  Wellbutrin was switched to the morning as well and added trazodone to replace Xanax for sleep aid.  She tried 4 sessions of psychotherapy previously and did not want to pursue at time of initial appointment.  She reported a family history of Parkinson's disease in her father and paternal uncle so we will need to keep this in mind prescribing.  Concentration impairment could be from depression and chronic poor sleep but was also noted to eat roughly 2 meals per day and small meals at that.  Coordinated with PCP for blood work and possible nutrition referral.  Able to successfully come off Xanax with cross titration of trazodone.  Plan:  # PTSD  single episode may depressive disorder, moderate  Past medication trials: See medication trials below Status of problem: Improving Interventions: -- Continue Wellbutrin XL 150 mg daily  # Primary insomnia Past medication trials: Ambien, Xanax Status of problem: Improving Interventions: --continue trazodone 50-100mg  nightly (s7/15/24,  i8/19/24)  # Generalized anxiety disorder Past medication trials:  Status of problem: Improving Interventions: -- Wellbutrin as above  # Hypothyroidism Past medication trials:  Status of problem: Chronic and stable Interventions: -- Continue Synthroid per PCP  Patient was given contact information for behavioral health clinic and was instructed to call 911 for emergencies.   Subjective:  Chief Complaint:  Chief Complaint  Patient presents with   Anxiety   Depression   Trauma   Follow-up    Interval History: Did go back to work more significantly with 22hrs per week. Handling the change in stress well but recognizes the amount of time the job is taking up. Agreed to 53month stay. Was able to drive all the way to [facility] and back without panic attacks or having to pull over. Stayed in the slow lane to avoid 18 wheelers but proud of herself for being able to do it. Sleep has stayed hit or miss but the better sleep nights are more frequently and consistently minimum 6.5hrs per night. Did get bloodwork and only abnormality was thyroid with adjustment to her medication. Was also able to get nutrition referral. Holiday season will be rough with reminders of grandson's death. Still going to PT for her knee which has been helpful.   Visit Diagnosis:    ICD-10-CM   1. Major depressive disorder, single episode, moderate (HCC)  F32.1     2. PTSD (post-traumatic stress disorder)  F43.10     3. Primary insomnia  F51.01     4. Generalized anxiety disorder  F41.1         Past Psychiatric History:  Diagnoses: primary insomnia, history of long-term use of prescription benzodiazepine, generalized anxiety disorder, PTSD with single episode of major depressive disorder after her grandson's death in 02-06-2024hypothyroidism Medication trials: wellbutrin (effective) started in May 2024; prozac maybe in college, xanax (for insomnia), ambien (effective), trazodone (effective) Previous  psychiatrist/therapist: yes to therapy Hospitalizations: none Suicide attempts: none SIB: none Hx of violence towards others: none Current access to guns: yes secured in a gunsafe Hx of trauma/abuse: none Substance use: none  Past Medical History:  Past Medical History:  Diagnosis Date   Colon cancer screening 03/22/2023   Essential hypertension 02/13/2016   Hyperlipidemia    Hypothyroidism    Long-term current use of benzodiazepine 04/05/2023   Unspecified hypothyroidism 03/02/2013    Past Surgical History:  Procedure Laterality Date   ENDOMETRIAL ABLATION     ROTATOR CUFF REPAIR  20 years ago   WISDOM TOOTH EXTRACTION  09/22/1971    Family Psychiatric History: two children with ADHD, father and paternal uncle with Parkinson's disease   Family History:  Family History  Problem Relation Age of Onset   Heart Problems Mother    Heart attack Father        multiple   Breast cancer Neg Hx    Colon cancer Neg Hx     Social History:  Academic/Vocational: Chief Financial Officer, mostly retired  Social History   Socioeconomic History   Marital status: Divorced    Spouse name: Not on file   Number of children: Not on file   Years of education: Not on file   Highest education level: Not on file  Occupational History  Not on file  Tobacco Use   Smoking status: Never   Smokeless tobacco: Never  Substance and Sexual Activity   Alcohol use: Yes    Alcohol/week: 0.0 - 1.0 standard drinks of alcohol    Comment: 1 unit of alcohol every 3 to 4 months   Drug use: Never   Sexual activity: Never    Partners: Male    Birth control/protection: Post-menopausal  Other Topics Concern   Not on file  Social History Narrative   Not on file   Social Determinants of Health   Financial Resource Strain: Low Risk  (03/08/2023)   Received from [facility], [facility]   Overall Financial Resource Strain (CARDIA)    Difficulty of Paying Living Expenses: Not hard at all   Food Insecurity: No Food Insecurity (03/08/2023)   Received from [facility], [facility]   Hunger Vital Sign    Worried About Running Out of Food in the Last Year: Never true    Ran Out of Food in the Last Year: Never true  Transportation Needs: No Transportation Needs (03/08/2023)   Received from [facility], [facility]   PRAPARE - Transportation    Lack of Transportation (Medical): No    Lack of Transportation (Non-Medical): No  Physical Activity: Sufficiently Active (03/08/2023)   Received from [facility], [facility]   Exercise Vital Sign    Days of Exercise per Week: 5 days    Minutes of Exercise per Session: 30 min  Recent Concern: Physical Activity - Insufficiently Active (02/02/2023)   Received from [facility]   Exercise Vital Sign    Days of Exercise per Week: 2 days    Minutes of Exercise per Session: 30 min  Stress: Stress Concern Present (03/08/2023)   Received from [facility], [facility] - Occupational Stress Questionnaire    Feeling of Stress : Rather much  Social Connections: Somewhat Isolated (03/08/2023)   Received from [facility], [facility]   Social Network    How would you rate your social network (family, work, friends)?: Restricted participation with some degree of social isolation    Allergies:  Allergies  Allergen Reactions   Augmentin [Amoxicillin-Pot Clavulanate] Nausea And Vomiting   Ace Inhibitors Cough   Terbinafine Diarrhea    Current Medications: Current Outpatient Medications  Medication Sig Dispense Refill   levothyroxine (SYNTHROID) 150 MCG tablet Take 1 tablet by mouth daily.     levothyroxine (SYNTHROID) 175 MCG tablet Take 175 mcg by mouth. Three days weekly, MWF     amLODipine (NORVASC) 2.5 MG tablet TAKE 1 TABLET BY MOUTH EVERY DAY 90 tablet 3   aspirin EC 81 MG tablet Take 1 tablet (81 mg total) by mouth daily. 90 tablet 3   buPROPion (WELLBUTRIN XL) 150 MG 24 hr  tablet Take 1 tablet (150 mg total) by mouth every morning. 90 tablet 0   ciclopirox (PENLAC) 8 % solution APPLY TOPICALLY AT BEDTIME. APPLY OVER NAIL AND SURROUNDING SKIN. APPLY DAILY OVER PREVIOUS COAT. AFTER SEVEN (7) DAYS, MAY REMOVE WITH ALCOHOL AND CONTINUE CYCLE. 6.6 mL 2   fluticasone (FLONASE) 50 MCG/ACT nasal spray Place 1 spray into both nostrils as needed for allergies.      Multiple Vitamin (MULTIVITAMIN) tablet Take 1 tablet by mouth daily.     naproxen sodium (ALEVE) 220 MG tablet Take 220 mg by mouth daily as needed (pain).     rosuvastatin (CRESTOR) 20 MG tablet TAKE 1 TABLET  BY MOUTH EVERY DAY 90 tablet 3   traZODone (DESYREL) 100 MG tablet TAKE 1/2 OR 1 TABLET BY MOUTH NIGHTLY. 90 tablet 0   valsartan-hydrochlorothiazide (DIOVAN-HCT) 320-12.5 MG tablet TAKE 1 TABLET BY MOUTH EVERY DAY     No current facility-administered medications for this visit.    ROS: Review of Systems  Constitutional:  Negative for appetite change and unexpected weight change.  Endocrine: Positive for cold intolerance and heat intolerance. Negative for polyphagia.  Musculoskeletal:  Positive for arthralgias. Negative for back pain.  Skin:        No hair loss  Psychiatric/Behavioral:  Positive for sleep disturbance. Negative for decreased concentration, dysphoric mood, hallucinations, self-injury and suicidal ideas. The patient is nervous/anxious.     Objective:  Psychiatric Specialty Exam: Last menstrual period 08/02/2012.There is no height or weight on file to calculate BMI.  General Appearance: Casual, Fairly Groomed, and wearing glasses.  Appears stated age  Eye Contact:  Good  Speech:  Clear and Coherent and Normal Rate  Volume:  Normal  Mood:   "Things are going well but dreading the holiday season"  Affect:  Appropriate, Congruent, Constricted, and improving range.  Significantly brighter than previously  Thought Content: Logical and Hallucinations: None   Suicidal Thoughts:  No   Homicidal Thoughts:  No  Thought Process:  Coherent, Goal Directed, and Linear  Orientation:  Full (Time, Place, and Person)    Memory:  Grossly intact   Judgment:  Fair  Insight:  Fair  Concentration:  Concentration: Good  Recall:  not formally assessed   Fund of Knowledge: Fair  Language: Good  Psychomotor Activity:  Normal  Akathisia:  No  AIMS (if indicated): not done  Assets:  Communication Skills Desire for Improvement Financial Resources/Insurance Housing Leisure Time Physical Health Resilience Social Support Talents/Skills Transportation Vocational/Educational  ADL's:  Intact  Cognition: WNL  Sleep:  Poor but improving   PE: General: sits comfortably in view of camera; no acute distress  Pulm: no increased work of breathing on room air  MSK: all extremity movements appear intact  Neuro: no focal neurological deficits observed  Gait & Station: unable to assess by video    Metabolic Disorder Labs: No results found for: "HGBA1C", "MPG" No results found for: "PROLACTIN" No results found for: "CHOL", "TRIG", "HDL", "CHOLHDL", "VLDL", "LDLCALC" No results found for: "TSH"  Therapeutic Level Labs: No results found for: "LITHIUM" No results found for: "VALPROATE" No results found for: "CBMZ"  Screenings:  PHQ2-9    Flowsheet Row Office Visit from 04/05/2023 in [facility] at [facility] Total Score 4  PHQ-9 Total Score 18      Flowsheet Row Office Visit from 04/05/2023 in [facility] at [place]  C-SSRS RISK CATEGORY No Risk       Collaboration of Care: Collaboration of Care: Medication Management AEB as above and Primary Care Provider AEB as above  Patient/Guardian was advised Release of Information must be obtained prior to any record release in order to collaborate their care with an outside provider. Patient/Guardian was advised if they have not already done so to contact the  registration department to sign all necessary forms in order for [place] to release information regarding their care.   Consent: Patient/Guardian gives verbal consent for treatment and assignment of benefits for services provided during this visit. Patient/Guardian expressed understanding and agreed to proceed.   Televisit via video: I connected with patient on 08/09/23 at  9:30 AM  EST by a video enabled telemedicine application and verified that I am speaking with the correct person using two identifiers.  Location: Patient: Home in [place] Provider: remote office in [place]   I discussed the limitations of evaluation and management by telemedicine and the availability of in person appointments. The patient expressed understanding and agreed to proceed.  I discussed the assessment and treatment plan with the patient. The patient was provided an opportunity to ask questions and all were answered. The patient agreed with the plan and demonstrated an understanding of the instructions.   The patient was advised to call back or seek an in-person evaluation if the symptoms worsen or if the condition fails to improve as anticipated.  I provided 15 minutes dedicated to the care of this patient via video on the date of this encounter to include chart review, face-to-face time with the patient, medication management/counseling.  Elsie Lincoln, MD 08/09/2023, 9:37 AM

## 2023-08-09 NOTE — Patient Instructions (Signed)
We did not make any medication changes today.  The holiday season can be a rough time after the passing of a loved one and I encouraged you and your family to cry when you need to as it does serve a physiological benefit.

## 2023-08-17 ENCOUNTER — Other Ambulatory Visit (HOSPITAL_COMMUNITY): Payer: Self-pay | Admitting: Psychiatry

## 2023-08-17 DIAGNOSIS — F411 Generalized anxiety disorder: Secondary | ICD-10-CM

## 2023-08-17 DIAGNOSIS — F321 Major depressive disorder, single episode, moderate: Secondary | ICD-10-CM

## 2023-08-25 ENCOUNTER — Ambulatory Visit: Payer: Medicare Other | Attending: Internal Medicine | Admitting: Internal Medicine

## 2023-08-25 ENCOUNTER — Encounter: Payer: Self-pay | Admitting: Internal Medicine

## 2023-08-25 VITALS — BP 134/72 | HR 62 | Resp 11 | Ht 64.0 in | Wt 210.4 lb

## 2023-08-25 DIAGNOSIS — I2510 Atherosclerotic heart disease of native coronary artery without angina pectoris: Secondary | ICD-10-CM | POA: Insufficient documentation

## 2023-08-25 DIAGNOSIS — Z131 Encounter for screening for diabetes mellitus: Secondary | ICD-10-CM | POA: Insufficient documentation

## 2023-08-25 DIAGNOSIS — E7800 Pure hypercholesterolemia, unspecified: Secondary | ICD-10-CM | POA: Diagnosis present

## 2023-08-25 DIAGNOSIS — I10 Essential (primary) hypertension: Secondary | ICD-10-CM | POA: Insufficient documentation

## 2023-08-25 NOTE — Patient Instructions (Addendum)
Medication Instructions:   *If you need a refill on your cardiac medications before your next appointment, please call your pharmacy*   Lab Work: NMR AND HGBA1C AND BMET  If you have labs (blood work) drawn today and your tests are completely normal, you will receive your results only by: MyChart Message (if you have MyChart) OR A paper copy in the mail If you have any lab test that is abnormal or we need to change your treatment, we will call you to review the results.   Testing/Procedures:    Follow-Up: At [facility], you and your health needs are our priority.  As part of our continuing mission to provide you with exceptional heart care, we have created designated Provider Care Teams.  These Care Teams include your primary Cardiologist (physician) and Advanced Practice Providers (APPs -  Physician Assistants and Nurse Practitioners) who all work together to provide you with the care you need, when you need it.  We recommend signing up for the patient portal called "MyChart".  Sign up information is provided on this After Visit Summary.  MyChart is used to connect with patients for Virtual Visits (Telemedicine).  Patients are able to view lab/test results, encounter notes, upcoming appointments, etc.  Non-urgent messages can be sent to your provider as well.   To learn more about what you can do with MyChart, go to [email or web address].    Your next appointment:  ONE YEAR

## 2023-08-25 NOTE — Progress Notes (Signed)
Cardiology Office Note   Date:  08/25/2023   ID:  Monique, [name] 23-Oct-1955, MRN [id number]  PCP:  Julien Girt, PA-C  Cardiologist:   Dietrich Pates, MD   F/U of CAD and HTN     History of Present Illness: Monique [name] is a 67 y.o. 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   buPROPion (WELLBUTRIN XL) 150 MG 24 hr tablet TAKE 1 TABLET BY MOUTH EVERY DAY IN THE MORNING   fluticasone (FLONASE) 50 MCG/ACT nasal spray Place 1 spray into both nostrils as needed for allergies.    levothyroxine (SYNTHROID) 150 MCG tablet Take 1 tablet by mouth daily.   levothyroxine (SYNTHROID) 175 MCG tablet Take 175 mcg by mouth. Three days weekly, MWF   Multiple Vitamin (MULTIVITAMIN) tablet Take 1 tablet by mouth daily.   naproxen sodium (ALEVE) 220 MG tablet Take 220 mg by mouth daily as needed (pain).   rosuvastatin (CRESTOR) 20 MG tablet TAKE 1 TABLET BY MOUTH EVERY DAY   traZODone (DESYREL) 100 MG tablet TAKE 1/2 OR 1 TABLET BY MOUTH NIGHTLY.   valsartan-hydrochlorothiazide (DIOVAN-HCT) 320-12.5 MG tablet TAKE 1 TABLET BY MOUTH EVERY DAY     Allergies:   Augmentin [amoxicillin-pot clavulanate], Ace inhibitors, and Terbinafine   Past Medical History:  Diagnosis Date   Colon cancer screening 03/22/2023   Essential hypertension 02/13/2016   Hyperlipidemia    Hypothyroidism    Long-term current use of benzodiazepine 04/05/2023    Unspecified hypothyroidism 03/02/2013    Past Surgical History:  Procedure Laterality Date   ENDOMETRIAL ABLATION     ROTATOR CUFF REPAIR  20 years ago   WISDOM TOOTH EXTRACTION  09/22/1971     Social History:  The patient  reports that she has never smoked. She has never used smokeless tobacco. She reports current alcohol use. She reports that she does not use drugs.   Family History:  The patient's family history includes Heart Problems in her mother; Heart attack in her father.    ROS:  Please see the history of present illness. All other systems are reviewed and  Negative to the above problem except as noted.    PHYSICAL EXAM: VS:  BP 134/72 (BP Location: Right Arm, Patient Position: Sitting, Cuff Size: Normal)   Pulse 62   Resp 11   Ht 5\' 4"  (1.626 m)   Wt 210 lb 6.4 oz (95.4 kg)   LMP 08/02/2012   SpO2 94%   BMI 36.12 kg/m   GEN morbidly obese 67 yo  in no acute distress  HEENT: normal  Neck: no JVD, No carotid bruit Cardiac: RRR; no murmurs   No LE edema  Respiratory:  clear to auscultation  GI: soft, nontender  EKG:  EKG is ordered  SR 61  bpm   Lipid Panel No results found for: "CHOL", "TRIG", "HDL", "CHOLHDL", "VLDL", "LDLCALC", "LDLDIRECT"    Wt Readings from Last 3 Encounters:  08/25/23 210 lb 6.4 oz (95.4 kg)  05/25/23 218 lb 6.4 oz (99.1 kg)  03/22/23 221 lb 6.4 oz (100.4 kg)      ASSESSMENT AND PLAN:  1  CAD Coronary calcifications noted on CT  Ca score of 110   On ASA  Remains asymptomatic    I do not think fatigue is an anginal equivalent   Follow   2  HL Check lipomed today   3  HTN BP is a little high  Follow   at home   4  HCM REviewed diet   Stay active   F/U in 1 year   CBC, lipomed, A1C, BMET   Current medicines are reviewed at length with the patient today.  The patient does not have concerns regarding medicines.  Signed, Dietrich Pates, MD  08/25/2023 8:57 AM    [facility] [address] [place], [place],  [place]  [address] Phone: [phone]; Fax: [phone]

## 2023-08-27 LAB — BASIC METABOLIC PANEL
BUN/Creatinine Ratio: 18 (ref 12–28)
BUN: 16 mg/dL (ref 8–27)
CO2: 28 mmol/L (ref 20–29)
Calcium: 9.6 mg/dL (ref 8.7–10.3)
Chloride: 103 mmol/L (ref 96–106)
Creatinine, Ser: 0.9 mg/dL (ref 0.57–1.00)
Glucose: 103 mg/dL — ABNORMAL HIGH (ref 70–99)
Potassium: 4.4 mmol/L (ref 3.5–5.2)
Sodium: 142 mmol/L (ref 134–144)
eGFR: 70 mL/min/{1.73_m2} (ref >59)

## 2023-08-27 LAB — NMR, LIPOPROFILE
Cholesterol, Total: 132 mg/dL (ref 100–199)
HDL Particle Number: 35.1 umol/L (ref >=30.5)
HDL-C: 49 mg/dL (ref >39)
LDL Particle Number: 725 nmol/L (ref <1000)
LDL Size: 20.7 nmol (ref >20.5)
LDL-C (NIH Calc): 61 mg/dL (ref 0–99)
LP-IR Score: 54 — ABNORMAL HIGH (ref <=45)
Small LDL Particle Number: 331 nmol/L (ref <=527)
Triglycerides: 126 mg/dL (ref 0–149)

## 2023-08-27 LAB — CBC
Hematocrit: 44.2 % (ref 34.0–46.6)
Hemoglobin: 14.8 g/dL (ref 11.1–15.9)
MCH: 30.7 pg (ref 26.6–33.0)
MCHC: 33.5 g/dL (ref 31.5–35.7)
MCV: 92 fL (ref 79–97)
Platelets: 275 10*3/uL (ref 150–450)
RBC: 4.82 x10E6/uL (ref 3.77–5.28)
RDW: 12.6 % (ref 11.7–15.4)
WBC: 6 10*3/uL (ref 3.4–10.8)

## 2023-08-27 LAB — HEMOGLOBIN A1C
Est. average glucose Bld gHb Est-mCnc: 108 mg/dL
Hgb A1c MFr Bld: 5.4 % (ref 4.8–5.6)

## 2023-08-30 ENCOUNTER — Other Ambulatory Visit (HOSPITAL_COMMUNITY): Payer: Self-pay | Admitting: Psychiatry

## 2023-08-30 DIAGNOSIS — F5101 Primary insomnia: Secondary | ICD-10-CM

## 2023-09-13 ENCOUNTER — Ambulatory Visit
Admission: RE | Admit: 2023-09-13 | Discharge: 2023-09-13 | Disposition: A | Discharge status: Home / Self Care | Payer: Medicare Other | Source: Ambulatory Visit | Attending: Physician Assistant | Admitting: Physician Assistant

## 2023-09-13 ENCOUNTER — Telehealth (HOSPITAL_COMMUNITY): Payer: Medicare Other | Admitting: Psychiatry

## 2023-09-13 ENCOUNTER — Encounter (HOSPITAL_COMMUNITY): Payer: Self-pay | Admitting: Psychiatry

## 2023-09-13 DIAGNOSIS — F5101 Primary insomnia: Secondary | ICD-10-CM | POA: Diagnosis not present

## 2023-09-13 DIAGNOSIS — Z1231 Encounter for screening mammogram for malignant neoplasm of breast: Secondary | ICD-10-CM

## 2023-09-13 DIAGNOSIS — F321 Major depressive disorder, single episode, moderate: Secondary | ICD-10-CM | POA: Diagnosis not present

## 2023-09-13 DIAGNOSIS — F411 Generalized anxiety disorder: Secondary | ICD-10-CM

## 2023-09-13 DIAGNOSIS — F4310 Post-traumatic stress disorder, unspecified: Secondary | ICD-10-CM

## 2023-09-13 NOTE — Progress Notes (Signed)
BH MD Outpatient Progress Note  09/13/2023 8:29 AM Monique [name]  MRN:  [id number]  Assessment:  Monique [name] presents for follow-up evaluation. Today, 09/13/23, patient reports ongoing stability of mood with the exception of the holiday season being a difficult reminder of her grandson's passing.  As such, had slight worsening of insomnia and the lead up to Christmas.  Overall though still sleeping more regularly with increased dose of trazodone.  Still working for increased hours but is handling that stress well.  Engaging in PT for her knee which has been helpful for mobility.  She is overall satisfied with medication regimen so we will keep Wellbutrin and trazodone the same for now. Still not interested in psychotherapy at this time.  Follow-up in 6 weeks.  For safety, her acute risk factors for suicide are: living alone, current diagnosis of depression and PTSD. Her chronic risk factors for suicide are: access to firearms, older age, living alone, chronic prescription benzodiazepine use. Her protective factors are: beloved pets in the home, supportive family, employment, actively seeking and engaging with mental health care, firearms are secured when not in use, no suicidal ideation in session. While future events cannot be fully predicted she does not currently meet IVC criteria and can be continued as an outpatient.  Identifying Information: Monique [name] is a 67 y.o. female with a history of primary insomnia, history of long-term use of prescription benzodiazepine, generalized anxiety disorder, PTSD with single episode of major depressive disorder after her grandson's death in 2024/02/04hypothyroidism who is an established patient with [facility] participating in follow-up via video conferencing. Initial evaluation of depression, insomnia, anxiety on 04/05/23; please see that note for full case formulation.  She was amenable to taper of Xanax planned  to discontinue after 1 month decreased dose.  Wellbutrin was switched to the morning as well and added trazodone to replace Xanax for sleep aid. Able to successfully come off Xanax with cross titration of trazodone.  Plan:  # PTSD  single episode may depressive disorder, moderate Past medication trials: See medication trials below Status of problem: Chronic with mild exacerbation Interventions: -- Continue Wellbutrin XL 150 mg daily  # Primary insomnia Past medication trials: Ambien, Xanax Status of problem: Chronic with mild exacerbation Interventions: --continue trazodone 50-100mg  nightly (s7/15/24, i8/19/24)  # Generalized anxiety disorder Past medication trials:  Status of problem: Improving Interventions: -- Wellbutrin as above  # Hypothyroidism Past medication trials:  Status of problem: Chronic and stable Interventions: -- Continue Synthroid per PCP  Patient was given contact information for behavioral health clinic and was instructed to call 911 for emergencies.   Subjective:  Chief Complaint:  Chief Complaint  Patient presents with   Anxiety   Depression   Trauma   Follow-up    Interval History: Until a couple weeks ago was doing fine, the holidays were the main stress increase and when getting present bags out several still had her grandson's name on them. The family decided to do Christmas light this year, whole family will be at her house which will be nice. Simultaneously getting easier and not changing; overall ok. Still able to drive without panic and only had two night awakenings recently. Consistently minimum 6.5hrs per night. Was also able to get nutrition referral. Holiday season will be rough with reminders of grandson's death. Still going to PT for her knee which has been helpful.   Visit Diagnosis:    ICD-10-CM   1. PTSD (post-traumatic  stress disorder)  F43.10     2. Major depressive disorder, single episode, moderate (HCC)  F32.1     3.  Generalized anxiety disorder  F41.1     4. Primary insomnia  F51.01          Past Psychiatric History:  Diagnoses: primary insomnia, history of long-term use of prescription benzodiazepine, generalized anxiety disorder, PTSD with single episode of major depressive disorder after her grandson's death in February 03, 2024hypothyroidism Medication trials: wellbutrin (effective) started in May 2024; prozac maybe in college, xanax (for insomnia), ambien (effective), trazodone (effective) Previous psychiatrist/therapist: yes to therapy Hospitalizations: none Suicide attempts: none SIB: none Hx of violence towards others: none Current access to guns: yes secured in a gunsafe Hx of trauma/abuse: none Substance use: none  Past Medical History:  Past Medical History:  Diagnosis Date   Colon cancer screening 03/22/2023   Essential hypertension 02/13/2016   Hyperlipidemia    Hypothyroidism    Long-term current use of benzodiazepine 04/05/2023   Unspecified hypothyroidism 03/02/2013    Past Surgical History:  Procedure Laterality Date   ENDOMETRIAL ABLATION     ROTATOR CUFF REPAIR  20 years ago   WISDOM TOOTH EXTRACTION  09/22/1971    Family Psychiatric History: two children with ADHD, father and paternal uncle with Parkinson's disease   Family History:  Family History  Problem Relation Age of Onset   Heart Problems Mother    Heart attack Father        multiple   Breast cancer Neg Hx    Colon cancer Neg Hx     Social History:  Academic/Vocational: Chief Financial Officer, mostly retired  Social History   Socioeconomic History   Marital status: Divorced    Spouse name: Not on file   Number of children: Not on file   Years of education: Not on file   Highest education level: Not on file  Occupational History   Not on file  Tobacco Use   Smoking status: Never   Smokeless tobacco: Never  Substance and Sexual Activity   Alcohol use: Yes    Alcohol/week: 0.0 - 1.0  standard drinks of alcohol    Comment: 1 unit of alcohol every 3 to 4 months   Drug use: Never   Sexual activity: Never    Partners: Male    Birth control/protection: Post-menopausal  Other Topics Concern   Not on file  Social History Narrative   Not on file   Social Drivers of Health   Financial Resource Strain: Low Risk  (03/08/2023)   Received from [facility]   Overall Financial Resource Strain (CARDIA)    Difficulty of Paying Living Expenses: Not hard at all  Food Insecurity: No Food Insecurity (03/08/2023)   Received from [facility]   Hunger Vital Sign    Worried About Running Out of Food in the Last Year: Never true    Ran Out of Food in the Last Year: Never true  Transportation Needs: No Transportation Needs (03/08/2023)   Received from [facility], [facility]   PRAPARE - Transportation    Lack of Transportation (Medical): No    Lack of Transportation (Non-Medical): No  Physical Activity: Sufficiently Active (03/08/2023)   Received from [facility], [facility]   Exercise Vital Sign    Days of Exercise per Week: 5 days    Minutes of Exercise per Session: 30 min  Recent Concern: Physical Activity - Insufficiently Active (02/02/2023)   Received from  [facility]   Exercise Vital Sign    Days of Exercise per Week: 2 days    Minutes of Exercise per Session: 30 min  Stress: Stress Concern Present (03/08/2023)   Received from [facility] - Occupational Stress Questionnaire    Feeling of Stress : Rather much  Social Connections: Somewhat Isolated (03/08/2023)   Received from [facility], [facility]   Social Network    How would you rate your social network (family, work, friends)?: Restricted participation with some degree of social isolation    Allergies:  Allergies  Allergen Reactions   Augmentin [Amoxicillin-Pot Clavulanate] Nausea And Vomiting   Ace Inhibitors  Cough   Terbinafine Diarrhea    Current Medications: Current Outpatient Medications  Medication Sig Dispense Refill   amLODipine (NORVASC) 2.5 MG tablet TAKE 1 TABLET BY MOUTH EVERY DAY 90 tablet 3   aspirin EC 81 MG tablet Take 1 tablet (81 mg total) by mouth daily. 90 tablet 3   buPROPion (WELLBUTRIN XL) 150 MG 24 hr tablet TAKE 1 TABLET BY MOUTH EVERY DAY IN THE MORNING 90 tablet 0   ciclopirox (PENLAC) 8 % solution APPLY TOPICALLY AT BEDTIME. APPLY OVER NAIL AND SURROUNDING SKIN. APPLY DAILY OVER PREVIOUS COAT. AFTER SEVEN (7) DAYS, MAY REMOVE WITH ALCOHOL AND CONTINUE CYCLE. 6.6 mL 2   fluticasone (FLONASE) 50 MCG/ACT nasal spray Place 1 spray into both nostrils as needed for allergies.      levothyroxine (SYNTHROID) 150 MCG tablet Take 1 tablet by mouth daily.     levothyroxine (SYNTHROID) 175 MCG tablet Take 175 mcg by mouth. Three days weekly, MWF     Multiple Vitamin (MULTIVITAMIN) tablet Take 1 tablet by mouth daily.     naproxen sodium (ALEVE) 220 MG tablet Take 220 mg by mouth daily as needed (pain).     rosuvastatin (CRESTOR) 20 MG tablet TAKE 1 TABLET BY MOUTH EVERY DAY 90 tablet 3   traZODone (DESYREL) 100 MG tablet TAKE 1/2 OR 1 TABLET BY MOUTH NIGHTLY. 90 tablet 0   valsartan-hydrochlorothiazide (DIOVAN-HCT) 320-12.5 MG tablet TAKE 1 TABLET BY MOUTH EVERY DAY     No current facility-administered medications for this visit.    ROS: Review of Systems  Constitutional:  Negative for appetite change and unexpected weight change.  Endocrine: Positive for cold intolerance and heat intolerance. Negative for polyphagia.  Musculoskeletal:  Positive for arthralgias. Negative for back pain.  Skin:        No hair loss  Psychiatric/Behavioral:  Positive for dysphoric mood and sleep disturbance. Negative for decreased concentration, hallucinations, self-injury and suicidal ideas. The patient is nervous/anxious.     Objective:  Psychiatric Specialty Exam: Last menstrual period  08/02/2012.There is no height or weight on file to calculate BMI.  General Appearance: Casual, Fairly Groomed, and wearing glasses.  Appears stated age  Eye Contact:  Good  Speech:  Clear and Coherent and Normal Rate  Volume:  Normal  Mood:   "Increased stress with the holiday season but okay"  Affect:  Appropriate, Congruent, Constricted, and improving range.  While still brighter, appropriate tearfulness  Thought Content: Logical and Hallucinations: None   Suicidal Thoughts:  No  Homicidal Thoughts:  No  Thought Process:  Coherent, Goal Directed, and Linear  Orientation:  Full (Time, Place, and Person)    Memory:  Grossly intact   Judgment:  Fair  Insight:  Fair  Concentration:  Concentration: Good  Recall:  not formally  assessed   Fund of Knowledge: Fair  Language: Good  Psychomotor Activity:  Normal  Akathisia:  No  AIMS (if indicated): not done  Assets:  Communication Skills Desire for Improvement Financial Resources/Insurance Housing Leisure Time Physical Health Resilience Social Support Talents/Skills Transportation Vocational/Educational  ADL's:  Intact  Cognition: WNL  Sleep:  Poor but stable   PE: General: sits comfortably in view of camera; no acute distress  Pulm: no increased work of breathing on room air  MSK: all extremity movements appear intact  Neuro: no focal neurological deficits observed  Gait & Station: unable to assess by video    Metabolic Disorder Labs: Lab Results  Component Value Date   HGBA1C 5.4 08/25/2023   No results found for: "PROLACTIN" No results found for: "CHOL", "TRIG", "HDL", "CHOLHDL", "VLDL", "LDLCALC" No results found for: "TSH"  Therapeutic Level Labs: No results found for: "LITHIUM" No results found for: "VALPROATE" No results found for: "CBMZ"  Screenings:  PHQ2-9    Flowsheet Row Office Visit from 04/05/2023 in [facility] Total Score 4  PHQ-9 Total Score 18       Flowsheet Row Office Visit from 04/05/2023 in [facility] at [place]  C-SSRS RISK CATEGORY No Risk       Collaboration of Care: Collaboration of Care: Medication Management AEB as above and Primary Care Provider AEB as above  Patient/Guardian was advised Release of Information must be obtained prior to any record release in order to collaborate their care with an outside provider. Patient/Guardian was advised if they have not already done so to contact the registration department to sign all necessary forms in order for [place] to release information regarding their care.   Consent: Patient/Guardian gives verbal consent for treatment and assignment of benefits for services provided during this visit. Patient/Guardian expressed understanding and agreed to proceed.   Televisit via video: I connected with patient on 09/13/23 at  8:00 AM EST by a video enabled telemedicine application and verified that I am speaking with the correct person using two identifiers.  Location: Patient: Home in [place] Provider: remote office in [place]   I discussed the limitations of evaluation and management by telemedicine and the availability of in person appointments. The patient expressed understanding and agreed to proceed.  I discussed the assessment and treatment plan with the patient. The patient was provided an opportunity to ask questions and all were answered. The patient agreed with the plan and demonstrated an understanding of the instructions.   The patient was advised to call back or seek an in-person evaluation if the symptoms worsen or if the condition fails to improve as anticipated.  I provided 15 minutes dedicated to the care of this patient via video on the date of this encounter to include chart review, face-to-face time with the patient, medication management/counseling.  Elsie Lincoln, MD 09/13/2023, 8:29 AM

## 2023-09-13 NOTE — Patient Instructions (Signed)
We did not make any medication changes today.  Keep up the good work with talking with family and processing her feelings with them.  Remember to breathe through the holiday season and it is okay to cry as that provides a therapeutic benefit.

## 2023-09-23 ENCOUNTER — Other Ambulatory Visit: Payer: Self-pay | Admitting: Internal Medicine

## 2023-11-05 ENCOUNTER — Telehealth (HOSPITAL_COMMUNITY): Payer: Medicare Other | Admitting: Psychiatry

## 2023-11-05 ENCOUNTER — Encounter (HOSPITAL_COMMUNITY): Payer: Self-pay | Admitting: Psychiatry

## 2023-11-05 DIAGNOSIS — F321 Major depressive disorder, single episode, moderate: Secondary | ICD-10-CM | POA: Diagnosis not present

## 2023-11-05 DIAGNOSIS — F411 Generalized anxiety disorder: Secondary | ICD-10-CM

## 2023-11-05 DIAGNOSIS — F4310 Post-traumatic stress disorder, unspecified: Secondary | ICD-10-CM

## 2023-11-05 DIAGNOSIS — F5101 Primary insomnia: Secondary | ICD-10-CM | POA: Diagnosis not present

## 2023-11-05 MED ORDER — TRAZODONE HCL 100 MG PO TABS: ORAL_TABLET | ORAL | 0 refills | Status: DC

## 2023-11-05 MED ORDER — BUPROPION HCL ER (XL) 150 MG PO TB24
150.0000 mg | ORAL_TABLET | Freq: Every day | ORAL | 0 refills | Status: DC
Start: 2023-11-05 — End: 2024-01-21

## 2023-11-05 NOTE — Patient Instructions (Signed)
We did not make any medication changes.  Keep up the good work with self-care.

## 2023-11-05 NOTE — Progress Notes (Signed)
BH MD Outpatient Progress Note  11/05/2023 8:31 AM Monique [name]  MRN:  [id number]  Assessment:  [name] presents for follow-up evaluation. Today, 11/05/23, patient reports improvement in mood after getting through the holiday season which included her grandson's birthday and death anniversary which roughly 3 days apart in October 16, 2023.  Since that time the ongoing improvement of mood has continued.  Similarly, during the holiday season, had slight worsening of insomnia but has since resolved and improved up to 7.5 hours per night. Still working for increased hours but is handling that stress well.  Has completed PT for her knee.  She is overall satisfied with medication regimen so we will keep Wellbutrin and trazodone the same for now. Still not interested in psychotherapy at this time.  Follow-up in 8 weeks.  For safety, her acute risk factors for suicide are: living alone, current diagnosis of depression and PTSD. Her chronic risk factors for suicide are: access to firearms, older age, living alone, chronic prescription benzodiazepine use. Her protective factors are: beloved pets in the home, supportive family, employment, actively seeking and engaging with mental health care, firearms are secured when not in use, no suicidal ideation in session. While future events cannot be fully predicted she does not currently meet IVC criteria and can be continued as an outpatient.  Identifying Information: Monique [name] is a 68 y.o. female with a history of primary insomnia, history of long-term use of prescription benzodiazepine, generalized anxiety disorder, PTSD with single episode of major depressive disorder after her grandson's death in 01-25-2024hypothyroidism who is an established patient with [facility] participating in follow-up via video conferencing. Initial evaluation of depression, insomnia, anxiety on 04/05/23; please see that note for full case  formulation.  She was amenable to taper of Xanax planned to discontinue after 1 month decreased dose.  Wellbutrin was switched to the morning as well and added trazodone to replace Xanax for sleep aid. Able to successfully come off Xanax with cross titration of trazodone.  Plan:  # PTSD  single episode may depressive disorder, moderate Past medication trials: See medication trials below Status of problem: improving Interventions: -- Continue Wellbutrin XL 150 mg daily  # Primary insomnia Past medication trials: Ambien, Xanax Status of problem: improving Interventions: --continue trazodone 50-100mg  nightly (s7/15/24, i8/19/24)  # Generalized anxiety disorder Past medication trials:  Status of problem: Improving Interventions: -- Wellbutrin as above  # Hypothyroidism Past medication trials:  Status of problem: Chronic and stable Interventions: -- Continue Synthroid per PCP  Patient was given contact information for behavioral health clinic and was instructed to call 911 for emergencies.   Subjective:  Chief Complaint:  Chief Complaint  Patient presents with   Anxiety   Depression   Follow-up   Trauma    Interval History: Things have been pretty good after an awful 10/16/23. Thinks getting there. Family did get together and visited [place] and made it through grandson's birthdate and death anniversary. Her son was able to clean out grandson's room. In 10-16-23 was waking up 3-4x per night but now back to once per night. Averaging 6.5-7.5hrs per night. Meals are going better and is making meals again. Still not driving when rainy/dark but can drive easily in clear weather during the day. Was also able to get nutrition referral but had to reschedule. Finished PT for her knee which has been helpful has been walking on a new treadmill.   Visit Diagnosis:    ICD-10-CM  1. PTSD (post-traumatic stress disorder)  F43.10     2. Generalized anxiety disorder  F41.1 buPROPion  (WELLBUTRIN XL) 150 MG 24 hr tablet    3. Major depressive disorder, single episode, moderate (HCC)  F32.1 buPROPion (WELLBUTRIN XL) 150 MG 24 hr tablet    4. Primary insomnia  F51.01 traZODone (DESYREL) 100 MG tablet          Past Psychiatric History:  Diagnoses: primary insomnia, history of long-term use of prescription benzodiazepine, generalized anxiety disorder, PTSD with single episode of major depressive disorder after her grandson's death in 2024-02-11hypothyroidism Medication trials: wellbutrin (effective) started in May 2024; prozac maybe in college, xanax (for insomnia), ambien (effective), trazodone (effective) Previous psychiatrist/therapist: yes to therapy Hospitalizations: none Suicide attempts: none SIB: none Hx of violence towards others: none Current access to guns: yes secured in a gunsafe Hx of trauma/abuse: none Substance use: none  Past Medical History:  Past Medical History:  Diagnosis Date   Colon cancer screening 03/22/2023   Essential hypertension 02/13/2016   Hyperlipidemia    Hypothyroidism    Long-term current use of benzodiazepine 04/05/2023   Unspecified hypothyroidism 03/02/2013    Past Surgical History:  Procedure Laterality Date   ENDOMETRIAL ABLATION     ROTATOR CUFF REPAIR  20 years ago   WISDOM TOOTH EXTRACTION  09/22/1971    Family Psychiatric History: two children with ADHD, father and paternal uncle with Parkinson's disease   Family History:  Family History  Problem Relation Age of Onset   Heart Problems Mother    Heart attack Father        multiple   Breast cancer Neg Hx    Colon cancer Neg Hx     Social History:  Academic/Vocational: Chief Financial Officer, mostly retired  Social History   Socioeconomic History   Marital status: Divorced    Spouse name: Not on file   Number of children: Not on file   Years of education: Not on file   Highest education level: Not on file  Occupational History   Not on  file  Tobacco Use   Smoking status: Never   Smokeless tobacco: Never  Substance and Sexual Activity   Alcohol use: Yes    Alcohol/week: 0.0 - 1.0 standard drinks of alcohol    Comment: 1 unit of alcohol every 3 to 4 months   Drug use: Never   Sexual activity: Never    Partners: Male    Birth control/protection: Post-menopausal  Other Topics Concern   Not on file  Social History Narrative   Not on file   Social Drivers of Health   Financial Resource Strain: Low Risk  (03/08/2023)   Received from [facility]   Overall Financial Resource Strain (CARDIA)    Difficulty of Paying Living Expenses: Not hard at all  Food Insecurity: No Food Insecurity (03/08/2023)   Received from [facility], [facility]   Hunger Vital Sign    Worried About Running Out of Food in the Last Year: Never true    Ran Out of Food in the Last Year: Never true  Transportation Needs: No Transportation Needs (03/08/2023)   Received from [facility], [facility]   PRAPARE - Transportation    Lack of Transportation (Medical): No    Lack of Transportation (Non-Medical): No  Physical Activity: Sufficiently Active (03/08/2023)   Received from [facility], [facility]   Exercise Vital Sign    Days of Exercise per Week: 5 days  Minutes of Exercise per Session: 30 min  Recent Concern: Physical Activity - Insufficiently Active (02/02/2023)   Received from [facility]   Exercise Vital Sign    Days of Exercise per Week: 2 days    Minutes of Exercise per Session: 30 min  Stress: Stress Concern Present (03/08/2023)   Received from [facility], [facility] - Occupational Stress Questionnaire    Feeling of Stress : Rather much  Social Connections: Somewhat Isolated (03/08/2023)   Received from [facility], [facility]   Social Network    How would you rate your social network (family, work, friends)?: Restricted participation with some  degree of social isolation    Allergies:  Allergies  Allergen Reactions   Augmentin [Amoxicillin-Pot Clavulanate] Nausea And Vomiting   Ace Inhibitors Cough   Terbinafine Diarrhea    Current Medications: Current Outpatient Medications  Medication Sig Dispense Refill   amLODipine (NORVASC) 2.5 MG tablet TAKE 1 TABLET BY MOUTH EVERY DAY 90 tablet 3   aspirin EC 81 MG tablet Take 1 tablet (81 mg total) by mouth daily. 90 tablet 3   buPROPion (WELLBUTRIN XL) 150 MG 24 hr tablet Take 1 tablet (150 mg total) by mouth daily. 90 tablet 0   fluticasone (FLONASE) 50 MCG/ACT nasal spray Place 1 spray into both nostrils as needed for allergies.      levothyroxine (SYNTHROID) 150 MCG tablet Take 1 tablet by mouth daily.     levothyroxine (SYNTHROID) 175 MCG tablet Take 175 mcg by mouth. Three days weekly, MWF     Multiple Vitamin (MULTIVITAMIN) tablet Take 1 tablet by mouth daily.     naproxen sodium (ALEVE) 220 MG tablet Take 220 mg by mouth daily as needed (pain).     rosuvastatin (CRESTOR) 20 MG tablet TAKE 1 TABLET BY MOUTH EVERY DAY 90 tablet 3   traZODone (DESYREL) 100 MG tablet TAKE 1/2 OR 1 TABLET BY MOUTH NIGHTLY. 90 tablet 0   valsartan-hydrochlorothiazide (DIOVAN-HCT) 320-12.5 MG tablet TAKE 1 TABLET BY MOUTH EVERY DAY     No current facility-administered medications for this visit.    ROS: Review of Systems  Constitutional:  Negative for appetite change and unexpected weight change.  Endocrine: Positive for cold intolerance and heat intolerance. Negative for polyphagia.  Musculoskeletal:  Positive for arthralgias. Negative for back pain.  Skin:        No hair loss  Psychiatric/Behavioral:  Positive for sleep disturbance. Negative for decreased concentration, dysphoric mood, hallucinations, self-injury and suicidal ideas. The patient is not nervous/anxious.     Objective:  Psychiatric Specialty Exam: Last menstrual period 08/02/2012.There is no height or weight on file to  calculate BMI.  General Appearance: Casual, Fairly Groomed, and wearing glasses.  Appears stated age  Eye Contact:  Good  Speech:  Clear and Coherent and Normal Rate  Volume:  Normal  Mood:   "Doing good, I think we are making progress"  Affect:  Appropriate, Congruent, and improving range.  While still brighter, reactive affect to topics discussed  Thought Content: Logical and Hallucinations: None   Suicidal Thoughts:  No  Homicidal Thoughts:  No  Thought Process:  Coherent, Goal Directed, and Linear  Orientation:  Full (Time, Place, and Person)    Memory:  Grossly intact   Judgment:  Fair  Insight:  Fair  Concentration:  Concentration: Good  Recall:  not formally assessed   Fund of Knowledge: Fair  Language: Good  Psychomotor Activity:  Normal  Akathisia:  No  AIMS (if indicated): not done  Assets:  Communication Skills Desire for Improvement Financial Resources/Insurance Housing Leisure Time Physical Health Resilience Social Support Talents/Skills Transportation Vocational/Educational  ADL's:  Intact  Cognition: WNL  Sleep:  Fair    PE: General: sits comfortably in view of camera; no acute distress  Pulm: no increased work of breathing on room air  MSK: all extremity movements appear intact  Neuro: no focal neurological deficits observed  Gait & Station: unable to assess by video    Metabolic Disorder Labs: Lab Results  Component Value Date   HGBA1C 5.4 08/25/2023   No results found for: "PROLACTIN" No results found for: "CHOL", "TRIG", "HDL", "CHOLHDL", "VLDL", "LDLCALC" No results found for: "TSH"  Therapeutic Level Labs: No results found for: "LITHIUM" No results found for: "VALPROATE" No results found for: "CBMZ"  Screenings:  PHQ2-9    Flowsheet Row Office Visit from 04/05/2023 in [facility] Total Score 4  PHQ-9 Total Score 18      Flowsheet Row Office Visit from 04/05/2023 in [facility] at [place]  C-SSRS RISK CATEGORY No Risk       Collaboration of Care: Collaboration of Care: Medication Management AEB as above and Primary Care Provider AEB as above  Patient/Guardian was advised Release of Information must be obtained prior to any record release in order to collaborate their care with an outside provider. Patient/Guardian was advised if they have not already done so to contact the registration department to sign all necessary forms in order for [place] to release information regarding their care.   Consent: Patient/Guardian gives verbal consent for treatment and assignment of benefits for services provided during this visit. Patient/Guardian expressed understanding and agreed to proceed.   Televisit via video: I connected with patient on 11/05/23 at  8:00 AM EST by a video enabled telemedicine application and verified that I am speaking with the correct person using two identifiers.  Location: Patient: Home in [place] Provider: remote office in [place]   I discussed the limitations of evaluation and management by telemedicine and the availability of in person appointments. The patient expressed understanding and agreed to proceed.  I discussed the assessment and treatment plan with the patient. The patient was provided an opportunity to ask questions and all were answered. The patient agreed with the plan and demonstrated an understanding of the instructions.   The patient was advised to call back or seek an in-person evaluation if the symptoms worsen or if the condition fails to improve as anticipated.  I provided 15 minutes dedicated to the care of this patient via video on the date of this encounter to include chart review, face-to-face time with the patient, medication management/counseling.  Elsie Lincoln, MD 11/05/2023, 8:31 AM

## 2023-12-21 ENCOUNTER — Ambulatory Visit: Admitting: Orthopedic Surgery

## 2023-12-21 VITALS — BP 133/73 | HR 66

## 2023-12-21 DIAGNOSIS — M1711 Unilateral primary osteoarthritis, right knee: Secondary | ICD-10-CM | POA: Diagnosis not present

## 2023-12-21 NOTE — Patient Instructions (Signed)

## 2023-12-21 NOTE — Progress Notes (Unsigned)
 Return patient Visit  Assessment: Monique [name] is a 68 y.o. female with the following: 1. Arthritis of right knee  Plan: Monique [name] has recurrence of pain in the right knee.  Previous injection was helpful.  She would like to proceed with another injection.  This was completed in clinic today without issues.   Procedure note injection Right knee joint   Verbal consent was obtained to inject the right knee joint  Timeout was completed to confirm the site of injection.  The skin was prepped with alcohol and ethyl chloride was sprayed at the injection site.  A 21-gauge needle was used to inject 40 mg of Depo-Medrol and 1% lidocaine (4 cc) into the right knee using an anterolateral approach.  There were no complications. A sterile bandage was applied.   Follow-up: No follow-ups on file.  Subjective:  Chief Complaint  Patient presents with   Knee Pain    R pain has been coming back over the past 3 wks. Pt states pain is more noticeable going up stairs. Would like another injection if possible.     History of Present Illness: Monique [name] is a 68 y.o. female who returns for repeat evaluation of right knee pain.  I last saw her in clinic, greater than 6 months ago.  Right knee injection was very effective.  She started to notice pain over the past 3 weeks.  No specific injury.  In addition, she is planning a trip with family, and will be doing a lot of walking.  She is interested in another injection.   Review of Systems: No fevers or chills No numbness or tingling No chest pain No shortness of breath No bowel or bladder dysfunction No GI distress No headaches    Objective: BP 133/73   Pulse 66   LMP 08/02/2012   Physical Exam:  General: Alert and oriented. and No acute distress. Gait: Right sided antalgic gait.  Evaluation of the right knee is without swelling.  No obvious deformity.  No increased laxity varus or valgus stress.  Negative  Lachman.  She is able to achieve full extension.  She tolerates flexion beyond 100 degrees.  Tenderness to palpation along the medial and lateral joint lines.  Positive crepitus with range of motion testing.  IMAGING: I personally ordered and reviewed the following images   No new imaging obtained today.   New Medications:  No orders of the defined types were placed in this encounter.     Oliver Barre, MD  12/21/2023 4:43 PM

## 2023-12-22 ENCOUNTER — Encounter: Payer: Self-pay | Admitting: Orthopedic Surgery

## 2024-01-21 ENCOUNTER — Encounter (HOSPITAL_COMMUNITY): Payer: Self-pay | Admitting: Psychiatry

## 2024-01-21 ENCOUNTER — Telehealth (HOSPITAL_COMMUNITY): Payer: Medicare Other | Admitting: Psychiatry

## 2024-01-21 DIAGNOSIS — F411 Generalized anxiety disorder: Secondary | ICD-10-CM

## 2024-01-21 DIAGNOSIS — F5101 Primary insomnia: Secondary | ICD-10-CM | POA: Diagnosis not present

## 2024-01-21 DIAGNOSIS — F4310 Post-traumatic stress disorder, unspecified: Secondary | ICD-10-CM

## 2024-01-21 DIAGNOSIS — F324 Major depressive disorder, single episode, in partial remission: Secondary | ICD-10-CM

## 2024-01-21 MED ORDER — TRAZODONE HCL 100 MG PO TABS
100.0000 mg | ORAL_TABLET | Freq: Every day | ORAL | 1 refills | Status: AC
Start: 2024-01-21 — End: ?

## 2024-01-21 MED ORDER — BUPROPION HCL ER (XL) 150 MG PO TB24: 150.0000 mg | ORAL_TABLET | Freq: Every day | ORAL | 1 refills | Status: AC

## 2024-01-21 NOTE — Progress Notes (Signed)
 BH MD Outpatient Progress Note  01/21/2024 8:30 AM Monique [name]  MRN:  [id number]  Assessment:  Monique [name] presents for follow-up evaluation. Today, 01/21/24, patient reports improvement in mood and had an excellent [place] trip with family.  Would consider depression in partial remission at this point.  She is able to recognize time since the son's death anniversary has been particularly helpful on this point.  Reviewed principals of exposure therapy that relates to highway driving and she will consider these as there is still slight phobia response to highway travel.  Insomnia still roughly 6-1/2 to 7-1/2 hours per night but able to fall asleep quickly on awakening during the night. Still working for increased hours but is handling that stress well.  She is overall satisfied with medication regimen so we will keep Wellbutrin  and trazodone  the same for now. Still not interested in psychotherapy at this time.  No follow-up planned due to provider transition.  For safety, her acute risk factors for suicide are: living alone, PTSD. Her chronic risk factors for suicide are: access to firearms, older age, living alone, chronic prescription benzodiazepine use. Her protective factors are: beloved pets in the home, supportive family, employment, actively seeking and engaging with mental health care, firearms are secured when not in use, no suicidal ideation in session. While future events cannot be fully predicted she does not currently meet IVC criteria and can be continued as an outpatient.  Identifying Information: Monique [name] is a 68 y.o. female with a history of primary insomnia, history of long-term use of prescription benzodiazepine, generalized anxiety disorder, PTSD with single episode of major depressive disorder after her grandson's death in 01-16-24hypothyroidism who is an established patient with [facility] participating in follow-up via video  conferencing. Initial evaluation of depression, insomnia, anxiety on 04/05/23; please see that note for full case formulation.  She was amenable to taper of Xanax planned to discontinue after 1 month decreased dose.  Wellbutrin  was switched to the morning as well and added trazodone  to replace Xanax for sleep aid. Able to successfully come off Xanax with cross titration of trazodone .  Plan:  # PTSD  single episode may depressive disorder, in partial remission Past medication trials: See medication trials below Status of problem: improving Interventions: -- Continue Wellbutrin  XL 150 mg daily  # Primary insomnia Past medication trials: Ambien, Xanax Status of problem: improving Interventions: --continue trazodone  100mg  nightly (s7/15/24, i8/19/24)  # Generalized anxiety disorder Past medication trials:  Status of problem: Improving Interventions: -- Wellbutrin  as above  # Hypothyroidism Past medication trials:  Status of problem: Chronic and stable Interventions: -- Continue Synthroid per PCP  Patient was given contact information for behavioral health clinic and was instructed to call 911 for emergencies.   Subjective:  Chief Complaint:  Chief Complaint  Patient presents with   Anxiety   Depression   Follow-up   Stress    Interval History: Doing well and thinks making progress. Had trip to [place] with family went really well. Only downside was came back with a head cold. The fog from grandson's death anniversary is lifting and is able to tend to more tasks around the home. Spring similarly helpful for this. Still averaging 6.5-7.5hrs per night and when awakening during the night able to get back to sleep. Meals are going better and is making meals again, 3 per day with small lunch. Was also able to get nutrition referral but had to reschedule. Still not driving  when rainy/dark but can drive in clear weather during the day; still avoiding the highway.  Reviewed principals of  exposure therapy.  Visit Diagnosis:    ICD-10-CM   1. PTSD (post-traumatic stress disorder)  F43.10     2. Primary insomnia  F51.01 traZODone  (DESYREL ) 100 MG tablet    3. Generalized anxiety disorder  F41.1 buPROPion  (WELLBUTRIN  XL) 150 MG 24 hr tablet    4. Major depressive disorder with single episode, in partial remission (HCC)  F32.4 buPROPion  (WELLBUTRIN  XL) 150 MG 24 hr tablet       Past Psychiatric History:  Diagnoses: primary insomnia, history of long-term use of prescription benzodiazepine, generalized anxiety disorder, PTSD with single episode of major depressive disorder after her grandson's death in 02-10-2024hypothyroidism Medication trials: wellbutrin  (effective) started in May 2024; prozac maybe in college, xanax (for insomnia), ambien (effective), trazodone  (effective) Previous psychiatrist/therapist: yes to therapy Hospitalizations: none Suicide attempts: none SIB: none Hx of violence towards others: none Current access to guns: yes secured in a gunsafe Hx of trauma/abuse: none Substance use: none  Past Medical History:  Past Medical History:  Diagnosis Date   Colon cancer screening 03/22/2023   Essential hypertension 02/13/2016   Hyperlipidemia    Hypothyroidism    Long-term current use of benzodiazepine 04/05/2023   Unspecified hypothyroidism 03/02/2013    Past Surgical History:  Procedure Laterality Date   ENDOMETRIAL ABLATION     ROTATOR CUFF REPAIR  20 years ago   WISDOM TOOTH EXTRACTION  09/22/1971    Family Psychiatric History: two children with ADHD, father and paternal uncle with Parkinson's disease   Family History:  Family History  Problem Relation Age of Onset   Heart Problems Mother    Heart attack Father        multiple   Breast cancer Neg Hx    Colon cancer Neg Hx     Social History:  Academic/Vocational: Chief Financial Officer, mostly retired  Social History   Socioeconomic History   Marital status: Divorced     Spouse name: Not on file   Number of children: Not on file   Years of education: Not on file   Highest education level: Not on file  Occupational History   Not on file  Tobacco Use   Smoking status: Never   Smokeless tobacco: Never  Substance and Sexual Activity   Alcohol use: Yes    Alcohol/week: 0.0 - 1.0 standard drinks of alcohol    Comment: 1 unit of alcohol every 3 to 4 months   Drug use: Never   Sexual activity: Never    Partners: Male    Birth control/protection: Post-menopausal  Other Topics Concern   Not on file  Social History Narrative   Not on file   Social Drivers of Health   Financial Resource Strain: Low Risk  (03/08/2023)   Received from [facility], [facility]   Overall Financial Resource Strain (CARDIA)    Difficulty of Paying Living Expenses: Not hard at all  Food Insecurity: No Food Insecurity (03/08/2023)   Received from [facility], [facility]   Hunger Vital Sign    Worried About Running Out of Food in the Last Year: Never true    Ran Out of Food in the Last Year: Never true  Transportation Needs: No Transportation Needs (03/08/2023)   Received from [facility], [facility]   PRAPARE - Transportation    Lack of Transportation (Medical): No    Lack of Transportation (Non-Medical): No  Physical Activity: Sufficiently Active (03/08/2023)   Received from [facility], [facility]   Exercise Vital Sign    Days of Exercise per Week: 5 days    Minutes of Exercise per Session: 30 min  Recent Concern: Physical Activity - Insufficiently Active (02/02/2023)   Received from [facility]   Exercise Vital Sign    Days of Exercise per Week: 2 days    Minutes of Exercise per Session: 30 min  Stress: Stress Concern Present (03/08/2023)   Received from [facility], [facility] - Occupational Stress Questionnaire    Feeling of Stress : Rather much  Social Connections: Somewhat Isolated (03/08/2023)    Received from [facility], [facility]   Social Network    How would you rate your social network (family, work, friends)?: Restricted participation with some degree of social isolation    Allergies:  Allergies  Allergen Reactions   Augmentin [Amoxicillin-Pot Clavulanate] Nausea And Vomiting   Ace Inhibitors Cough   Terbinafine  Diarrhea    Current Medications: Current Outpatient Medications  Medication Sig Dispense Refill   amLODipine  (NORVASC ) 2.5 MG tablet TAKE 1 TABLET BY MOUTH EVERY DAY 90 tablet 3   aspirin  EC 81 MG tablet Take 1 tablet (81 mg total) by mouth daily. 90 tablet 3   buPROPion  (WELLBUTRIN  XL) 150 MG 24 hr tablet Take 1 tablet (150 mg total) by mouth daily. 90 tablet 1   fluticasone (FLONASE) 50 MCG/ACT nasal spray Place 1 spray into both nostrils as needed for allergies.      levothyroxine (SYNTHROID) 150 MCG tablet Take 1 tablet by mouth daily.     levothyroxine (SYNTHROID) 175 MCG tablet Take 175 mcg by mouth. Three days weekly, MWF     Multiple Vitamin (MULTIVITAMIN) tablet Take 1 tablet by mouth daily.     naproxen sodium (ALEVE) 220 MG tablet Take 220 mg by mouth daily as needed (pain).     rosuvastatin  (CRESTOR ) 20 MG tablet TAKE 1 TABLET BY MOUTH EVERY DAY 90 tablet 3   traZODone  (DESYREL ) 100 MG tablet Take 1 tablet (100 mg total) by mouth at bedtime. 90 tablet 1   valsartan-hydrochlorothiazide (DIOVAN-HCT) 320-12.5 MG tablet TAKE 1 TABLET BY MOUTH EVERY DAY     No current facility-administered medications for this visit.    ROS: Review of Systems  Constitutional:  Negative for appetite change and unexpected weight change.  Endocrine: Positive for cold intolerance and heat intolerance. Negative for polyphagia.  Musculoskeletal:  Positive for arthralgias. Negative for back pain.  Skin:        No hair loss  Psychiatric/Behavioral:  Positive for sleep disturbance. Negative for decreased concentration, dysphoric mood, hallucinations, self-injury and  suicidal ideas. The patient is not nervous/anxious.     Objective:  Psychiatric Specialty Exam: Last menstrual period 08/02/2012.There is no height or weight on file to calculate BMI.  General Appearance: Casual, Fairly Groomed, and wearing glasses.  Appears stated age  Eye Contact:  Good  Speech:  Clear and Coherent and Normal Rate  Volume:  Normal  Mood:   "Well"  Affect:  Appropriate, Congruent, and improving range.  Bright and slightly anxious  Thought Content: Logical and Hallucinations: None   Suicidal Thoughts:  No  Homicidal Thoughts:  No  Thought Process:  Coherent, Goal Directed, and Linear  Orientation:  Full (Time, Place, and Person)    Memory:  Grossly intact   Judgment:  Fair  Insight:  Fair  Concentration:  Concentration: Good  Recall:  not formally assessed   Fund of Knowledge: Fair  Language: Good  Psychomotor Activity:  Normal  Akathisia:  No  AIMS (if indicated): not done  Assets:  Communication Skills Desire for Improvement Financial Resources/Insurance Housing Leisure Time Physical Health Resilience Social Support Talents/Skills Transportation Vocational/Educational  ADL's:  Intact  Cognition: WNL  Sleep:  Fair    PE: General: sits comfortably in view of camera; no acute distress  Pulm: no increased work of breathing on room air  MSK: all extremity movements appear intact  Neuro: no focal neurological deficits observed  Gait & Station: unable to assess by video    Metabolic Disorder Labs: Lab Results  Component Value Date   HGBA1C 5.4 08/25/2023   No results found for: "PROLACTIN" No results found for: "CHOL", "TRIG", "HDL", "CHOLHDL", "VLDL", "LDLCALC" No results found for: "TSH"  Therapeutic Level Labs: No results found for: "LITHIUM" No results found for: "VALPROATE" No results found for: "CBMZ"  Screenings:  PHQ2-9    Flowsheet Row Office Visit from 04/05/2023 in [facility] at [facility]  Total Score 4  PHQ-9 Total Score 18      Flowsheet Row Office Visit from 04/05/2023 in [facility] at [place]  C-SSRS RISK CATEGORY No Risk       Collaboration of Care: Collaboration of Care: Medication Management AEB as above and Primary Care Provider AEB as above  Patient/Guardian was advised Release of Information must be obtained prior to any record release in order to collaborate their care with an outside provider. Patient/Guardian was advised if they have not already done so to contact the registration department to sign all necessary forms in order for us  to release information regarding their care.   Consent: Patient/Guardian gives verbal consent for treatment and assignment of benefits for services provided during this visit. Patient/Guardian expressed understanding and agreed to proceed.   Televisit via video: I connected with patient on 01/21/24 at  8:00 AM EDT by a video enabled telemedicine application and verified that I am speaking with the correct person using two identifiers.  Location: Patient: Home in [place] Provider: remote office in [place]   I discussed the limitations of evaluation and management by telemedicine and the availability of in person appointments. The patient expressed understanding and agreed to proceed.  I discussed the assessment and treatment plan with the patient. The patient was provided an opportunity to ask questions and all were answered. The patient agreed with the plan and demonstrated an understanding of the instructions.   The patient was advised to call back or seek an in-person evaluation if the symptoms worsen or if the condition fails to improve as anticipated.  I provided 20 minutes dedicated to the care of this patient via video on the date of this encounter to include chart review, face-to-face time with the patient, medication management/counseling.  Madie Schilling, MD 01/21/2024, 8:30 AM

## 2024-01-21 NOTE — Patient Instructions (Signed)
 We did not make any medication changes today.  If you are interested in exposure therapy remember the principles that we reviewed today and consider them when doing potential highway travel.

## 2024-02-25 NOTE — Progress Notes (Signed)
 This patient's chart has been reviewed by a Care Connections Specialist.   Attempted to contact patient in order to discuss appointments and screenings due for the upcoming year.   Left message with Care Connection Specialist's contact information. and [employer] message with health maintenance recommendations and Care Connection Specialist's contact information. with recommendations and contact information.  Additional Comments:

## 2024-04-19 ENCOUNTER — Ambulatory Visit (INDEPENDENT_AMBULATORY_CARE_PROVIDER_SITE_OTHER): Admitting: Orthopedic Surgery

## 2024-04-19 ENCOUNTER — Other Ambulatory Visit (INDEPENDENT_AMBULATORY_CARE_PROVIDER_SITE_OTHER): Payer: Self-pay

## 2024-04-19 ENCOUNTER — Encounter: Payer: Self-pay | Admitting: Orthopedic Surgery

## 2024-04-19 VITALS — BP 145/82 | HR 54 | Ht 64.0 in | Wt 210.0 lb

## 2024-04-19 DIAGNOSIS — M79641 Pain in right hand: Secondary | ICD-10-CM

## 2024-04-19 DIAGNOSIS — S66811A Strain of other specified muscles, fascia and tendons at wrist and hand level, right hand, initial encounter: Secondary | ICD-10-CM | POA: Diagnosis not present

## 2024-04-19 NOTE — Progress Notes (Signed)
 Return Patient Visit  Assessment: Monique [name] is a 68 y.o. female with the following: 1. Strain of metacarpophalangeal joint of right hand, initial encounter   Plan: [name] the second MCP joint of the right hand.  Radiographs are reassuring.  Mild tenderness over the A1 pulley, which could represent an early trigger finger.  There is no catching sensations.  She most likely  injured the second MCP joint, and these can be slow to resolve.  This was discussed with the patient.  She states understanding.  Medications as needed.  Consider therapy if she is not getting better.  If she notes catching of the index finger, we can consider an injection for trigger finger.  She states understanding.  She will follow-up as needed.  Follow-up: Return if symptoms worsen or fail to improve.  Subjective:  Chief Complaint  Patient presents with   Hand Problem    Right index finger pain/at 2nd MCP area after trying to open a window hand rolled and pain increased after      History of Present Illness: Monique [name] is a 68 y.o. female who returns for evaluation of right hand pain.  A couple months ago, she dropped a window on her finger.  It twisted the index finger.  She had some pain.  It has been slow to resolve.  She has some residual swelling.  She notes restrictions in her range of motion.  She has difficulty opening jars with the right hand.  She notes pain in this area when she tries to cut with a knife.   Review of Systems: No fevers or chills No numbness or tingling No chest pain No shortness of breath No bowel or bladder dysfunction No GI distress No headaches    Objective: BP (!) 145/82   Pulse (!) 54   Ht 5' 4 (1.626 m)   Wt 210 lb (95.3 kg)   LMP 08/02/2012   BMI 36.05 kg/m   Physical Exam:  General: Alert and oriented. and No acute distress. Gait: Normal gait.  Right hand with mild residual swelling to the second MCP joint.  There is no  increased laxity to varus or valgus stress.  There is some tenderness in the area of the A1 pulley.  No active triggering.  Slightly restricted range of motion in the index finger.  She has good grip strength.  Fingers warm and well-perfused.  IMAGING: I personally ordered and reviewed the following images  The right hand were obtained in clinic today.  No acute injuries noted.  No dislocation.  Minimal degenerative changes.  No bony lesions.  Impression: Negative right hand x-ray   New Medications:  No orders of the defined types were placed in this encounter.     Oneil DELENA Horde, MD  04/19/2024 11:31 AM

## 2024-05-31 ENCOUNTER — Ambulatory Visit (INDEPENDENT_AMBULATORY_CARE_PROVIDER_SITE_OTHER): Admitting: Podiatry

## 2024-05-31 DIAGNOSIS — L600 Ingrowing nail: Secondary | ICD-10-CM

## 2024-05-31 NOTE — Progress Notes (Signed)
 Subjective:  Patient ID: Monique [name], female    DOB: [birth year],  MRN: [id number]  Chief Complaint  Patient presents with   Nail Problem    Right hallux nail discomfort     68 y.o. female presents with the above complaint.  Patient presents with complaint of right hallux lateral border ingrown painful to touch is progressing and worse worse with ambulation and shoe pressure she would like to discuss treatment options for pain scale 7 out of 10 dull aching nature she would like to have it removed.  She is is not taking any antibiotics   Review of Systems: Negative except as noted in the HPI. Denies N/V/F/Ch.  Past Medical History:  Diagnosis Date   Colon cancer screening 03/22/2023   Essential hypertension 02/13/2016   Hyperlipidemia    Hypothyroidism    Long-term current use of benzodiazepine 04/05/2023   Unspecified hypothyroidism 03/02/2013    Current Outpatient Medications:    amLODipine  (NORVASC ) 2.5 MG tablet, TAKE 1 TABLET BY MOUTH EVERY DAY, Disp: 90 tablet, Rfl: 3   aspirin  EC 81 MG tablet, Take 1 tablet (81 mg total) by mouth daily., Disp: 90 tablet, Rfl: 3   buPROPion  (WELLBUTRIN  XL) 150 MG 24 hr tablet, Take 1 tablet (150 mg total) by mouth daily., Disp: 90 tablet, Rfl: 1   fluticasone (FLONASE) 50 MCG/ACT nasal spray, Place 1 spray into both nostrils as needed for allergies. , Disp: , Rfl:    levothyroxine (SYNTHROID) 150 MCG tablet, Take 1 tablet by mouth daily., Disp: , Rfl:    levothyroxine (SYNTHROID) 175 MCG tablet, Take 175 mcg by mouth. Three days weekly, MWF, Disp: , Rfl:    meloxicam (MOBIC) 15 MG tablet, , Disp: , Rfl:    Multiple Vitamin (MULTIVITAMIN) tablet, Take 1 tablet by mouth daily., Disp: , Rfl:    naproxen sodium (ALEVE) 220 MG tablet, Take 220 mg by mouth daily as needed (pain)., Disp: , Rfl:    rosuvastatin  (CRESTOR ) 20 MG tablet, TAKE 1 TABLET BY MOUTH EVERY DAY, Disp: 90 tablet, Rfl: 3   traZODone  (DESYREL ) 100 MG tablet, Take 1 tablet  (100 mg total) by mouth at bedtime., Disp: 90 tablet, Rfl: 1   valsartan-hydrochlorothiazide (DIOVAN-HCT) 320-12.5 MG tablet, TAKE 1 TABLET BY MOUTH EVERY DAY, Disp: , Rfl:   Social History   Tobacco Use  Smoking Status Never  Smokeless Tobacco Never    Allergies  Allergen Reactions   Augmentin [Amoxicillin-Pot Clavulanate] Nausea And Vomiting   Ace Inhibitors Cough   Terbinafine  Diarrhea   Objective:  There were no vitals filed for this visit. There is no height or weight on file to calculate BMI. Constitutional Well developed. Well nourished.  Vascular Dorsalis pedis pulses palpable bilaterally. Posterior tibial pulses palpable bilaterally. Capillary refill normal to all digits.  No cyanosis or clubbing noted. Pedal hair growth normal.  Neurologic Normal speech. Oriented to person, place, and time. Epicritic sensation to light touch grossly present bilaterally.  Dermatologic Painful ingrowing nail at lateral nail borders of the hallux nail right. No other open wounds. No skin lesions.  Orthopedic: Normal joint ROM without pain or crepitus bilaterally. No visible deformities. No bony tenderness.   Radiographs: None Assessment:   1. Ingrown toenail of right foot    Plan:  Patient was evaluated and treated and all questions answered.  Ingrown Nail, right -Patient elects to proceed with minor surgery to remove ingrown toenail removal today. Consent reviewed and signed by patient. -Ingrown nail excised. See procedure  note. -Educated on post-procedure care including soaking. Written instructions provided and reviewed. -Patient to follow up in 2 weeks for nail check.  Procedure: Excision of Ingrown Toenail Location: Right 1st toe lateral nail borders. Anesthesia: Lidocaine  1% plain; 1.5 mL and Marcaine  0.5% plain; 1.5 mL, digital block. Skin Prep: Betadine. Dressing: Silvadene; telfa; dry, sterile, compression dressing. Technique: Following skin prep, the toe was  exsanguinated and a tourniquet was secured at the base of the toe. The affected nail border was freed, split with a nail splitter, and excised. Chemical matrixectomy was then performed with phenol and irrigated out with alcohol. The tourniquet was then removed and sterile dressing applied. Disposition: Patient tolerated procedure well. Patient to return in 2 weeks for follow-up.   No follow-ups on file.

## 2024-06-01 ENCOUNTER — Telehealth: Payer: Self-pay | Admitting: Podiatry

## 2024-06-01 NOTE — Telephone Encounter (Signed)
 Was seen yesterday evening: minor surgery to remove ingrown toenail removal today. -Ingrown nail excised. -Educated on post-procedure care including soaking.  She did not receive the -Educated on post-procedure care including soaking.  Can you please upload to MyChart. Thank you.

## 2024-06-02 NOTE — Telephone Encounter (Signed)
Thank you Irma!

## 2024-06-15 ENCOUNTER — Ambulatory Visit: Admitting: Podiatry

## 2024-06-15 ENCOUNTER — Ambulatory Visit (INDEPENDENT_AMBULATORY_CARE_PROVIDER_SITE_OTHER): Admitting: Podiatry

## 2024-06-15 VITALS — Ht 64.0 in | Wt 210.0 lb

## 2024-06-15 DIAGNOSIS — L600 Ingrowing nail: Secondary | ICD-10-CM | POA: Diagnosis not present

## 2024-06-15 NOTE — Progress Notes (Unsigned)
 Subjective: Chief Complaint  Patient presents with   Nail Problem    Rm 12 Pt is here to f/u on ingrown toe nail removal of the right hallux. Pt states some intermittent pain after prolonged activity.   68 year old female presents the office today for follow-up evaluation status post partial nail avulsion.  States that she is doing better and foot but the toenails felt quite sometime.  She does not report any drainage or pus.  No signs of infection that she reports.  No other concerns.  Objective: AAO x3, NAD DP/PT pulses palpable bilaterally, CRT less than 3 seconds Status post partial nail avulsion which appears to be healed.  Small scabbing still present but there is no drainage or pus.  There is no surrounding erythema, ascending cellulitis.  There is no fluctuance or crepitation but there is no malodor.  There is no signs of infection noted. No pain with calf compression, swelling, warmth, erythema  Assessment: 68 year old female status post partial nail avulsion  Plan: -All treatment options discussed with the patient including all alternatives, risks, complications.  -Appears to be healing well.  Continue washing with soap and water daily and keep the bandage on during the day and wearing shoes and socks but leave it open at nighttime.  Monitor any signs or symptoms of infection or reoccurrence. -Patient encouraged to call the office with any questions, concerns, change in symptoms.   Return if symptoms worsen or fail to improve.  Donnice JONELLE Fees DPM

## 2024-06-26 ENCOUNTER — Ambulatory Visit: Admitting: Podiatry

## 2024-07-27 ENCOUNTER — Other Ambulatory Visit: Payer: Self-pay | Admitting: Physician Assistant

## 2024-07-27 DIAGNOSIS — Z1231 Encounter for screening mammogram for malignant neoplasm of breast: Secondary | ICD-10-CM

## 2024-08-25 ENCOUNTER — Ambulatory Visit: Admitting: Internal Medicine

## 2024-09-07 NOTE — Progress Notes (Unsigned)
° °  Cardiology Office Note   Date:  09/07/2024   ID:  Monique [name], DOB [birth year], MRN [id number]  PCP:  Monique Snowman, PA-C  Cardiologist:   Monique Gull, MD   F/U of CAD and HTN     History of Present Illness: Monique [name] is a 68 y.o. 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     Allergies:   Augmentin [amoxicillin-pot clavulanate], Ace inhibitors, and Terbinafine    Past Medical History:  Diagnosis Date   Colon cancer screening 03/22/2023   Essential hypertension 02/13/2016   Hyperlipidemia    Hypothyroidism    Long-term current use of benzodiazepine 04/05/2023   Unspecified hypothyroidism 03/02/2013    Past Surgical History:  Procedure Laterality Date   ENDOMETRIAL ABLATION     ROTATOR CUFF REPAIR  20 years ago   WISDOM TOOTH EXTRACTION  09/22/1971     Social History:  The patient  reports that she has never smoked. She has never used smokeless tobacco. She reports current alcohol use. She reports that she does not use drugs.   Family History:  The patient's family history includes Heart Problems in her mother; Heart attack in her father.    ROS:  Please see the history of present illness. All other systems are reviewed and  Negative to the above problem except as noted.    PHYSICAL EXAM: VS:  LMP 08/02/2012   GEN morbidly obese 68 yo  in no acute distress  HEENT: normal  Neck: no JVD, No carotid bruit Cardiac: RRR; no  murmurs   No LE edema  Respiratory:  clear to auscultation  GI: soft, nontender  EKG:  EKG is ordered  SR 61 bpm   Lipid Panel No results found for: CHOL, TRIG, HDL, CHOLHDL, VLDL, LDLCALC, LDLDIRECT    Wt Readings from Last 3 Encounters:  06/15/24 210 lb (95.3 kg)  04/19/24 210 lb (95.3 kg)  08/25/23 210 lb 6.4 oz (95.4 kg)      ASSESSMENT AND PLAN:  1  CAD Coronary calcifications noted on CT  Ca score of 110   On ASA  Remains asymptomatic    I do not think fatigue is an anginal equivalent   Follow   2  HL Check lipomed today   3  HTN BP is a little high  Follow   at home   4  HCM REviewed diet   Stay active   F/U in 1 year   CBC, lipomed, A1C, BMET   Current medicines are reviewed at length with the patient today.  The patient does not have concerns regarding medicines.  Signed, Monique Gull, MD  09/07/2024 10:03 AM    [facility] [address] [place], [place], [place]  [address] Phone: [phone]; Fax: [phone]

## 2024-09-08 ENCOUNTER — Ambulatory Visit: Attending: Internal Medicine | Admitting: Internal Medicine

## 2024-09-08 ENCOUNTER — Encounter: Payer: Self-pay | Admitting: Internal Medicine

## 2024-09-08 VITALS — BP 124/60 | HR 65 | Ht 64.0 in | Wt 193.0 lb

## 2024-09-08 DIAGNOSIS — I10 Essential (primary) hypertension: Secondary | ICD-10-CM | POA: Diagnosis not present

## 2024-09-08 DIAGNOSIS — I2510 Atherosclerotic heart disease of native coronary artery without angina pectoris: Secondary | ICD-10-CM | POA: Insufficient documentation

## 2024-09-08 NOTE — Patient Instructions (Signed)
" °  Follow-Up: At [facility], you and your health needs are our priority.  As part of our continuing mission to provide you with exceptional heart care, our providers are all part of one team.  This team includes your primary Cardiologist (physician) and Advanced Practice Providers or APPs (Physician Assistants and Nurse Practitioners) who all work together to provide you with the care you need, when you need it.  Your next appointment:   12 month(s)  Provider:   Vina Gull, MD [place]             "

## 2024-09-17 ENCOUNTER — Other Ambulatory Visit: Payer: Self-pay | Admitting: Internal Medicine

## 2024-09-18 ENCOUNTER — Ambulatory Visit
Admission: RE | Admit: 2024-09-18 | Discharge: 2024-09-18 | Disposition: A | Source: Ambulatory Visit | Attending: Physician Assistant | Admitting: Physician Assistant

## 2024-09-18 DIAGNOSIS — Z1231 Encounter for screening mammogram for malignant neoplasm of breast: Secondary | ICD-10-CM
